# Patient Record
Sex: Female | Born: 1940 | Race: White | Hispanic: No | Smoking: Never smoker
Health system: Southern US, Community
[De-identification: ages and names within clinical notes are randomized; demographics above are authoritative.]

## PROBLEM LIST (undated history)

## (undated) DIAGNOSIS — I10 Essential (primary) hypertension: Secondary | ICD-10-CM

## (undated) DIAGNOSIS — I219 Acute myocardial infarction, unspecified: Secondary | ICD-10-CM

## (undated) DIAGNOSIS — I2510 Atherosclerotic heart disease of native coronary artery without angina pectoris: Secondary | ICD-10-CM

## (undated) DIAGNOSIS — E785 Hyperlipidemia, unspecified: Secondary | ICD-10-CM

## (undated) DIAGNOSIS — E875 Hyperkalemia: Secondary | ICD-10-CM

## (undated) DIAGNOSIS — I255 Ischemic cardiomyopathy: Secondary | ICD-10-CM

## (undated) HISTORY — DX: Acute myocardial infarction, unspecified: I21.9

## (undated) HISTORY — DX: Hyperlipidemia, unspecified: E78.5

## (undated) HISTORY — DX: Essential (primary) hypertension: I10

## (undated) HISTORY — DX: Hyperkalemia: E87.5

## (undated) HISTORY — DX: Ischemic cardiomyopathy: I25.5

## (undated) HISTORY — DX: Atherosclerotic heart disease of native coronary artery without angina pectoris: I25.10

## (undated) HISTORY — PX: ABDOMINAL HYSTERECTOMY: SHX81

## (undated) HISTORY — PX: CHOLECYSTECTOMY: SHX55

---

## 2008-04-29 ENCOUNTER — Inpatient Hospital Stay (HOSPITAL_COMMUNITY): Admission: AD | Admit: 2008-04-29 | Discharge: 2008-05-04 | Payer: Self-pay | Admitting: Cardiology

## 2008-04-29 HISTORY — PX: CARDIAC CATHETERIZATION: SHX172

## 2008-05-01 ENCOUNTER — Encounter: Payer: Self-pay | Admitting: Cardiology

## 2008-05-29 ENCOUNTER — Encounter (HOSPITAL_COMMUNITY): Admission: RE | Admit: 2008-05-29 | Discharge: 2008-06-28 | Payer: Self-pay | Admitting: Cardiology

## 2008-06-29 ENCOUNTER — Encounter (HOSPITAL_COMMUNITY): Admission: RE | Admit: 2008-06-29 | Discharge: 2008-07-29 | Payer: Self-pay | Admitting: Cardiology

## 2008-07-31 ENCOUNTER — Encounter (HOSPITAL_COMMUNITY): Admission: RE | Admit: 2008-07-31 | Discharge: 2008-08-30 | Payer: Self-pay | Admitting: Cardiology

## 2009-04-09 HISTORY — PX: CARDIOVASCULAR STRESS TEST: SHX262

## 2009-04-16 HISTORY — PX: US ECHOCARDIOGRAPHY: HXRAD669

## 2009-10-15 ENCOUNTER — Ambulatory Visit: Payer: Self-pay | Admitting: Cardiology

## 2010-04-24 ENCOUNTER — Ambulatory Visit (INDEPENDENT_AMBULATORY_CARE_PROVIDER_SITE_OTHER): Payer: Medicare Other | Admitting: Cardiology

## 2010-04-24 DIAGNOSIS — E119 Type 2 diabetes mellitus without complications: Secondary | ICD-10-CM

## 2010-04-24 DIAGNOSIS — I2510 Atherosclerotic heart disease of native coronary artery without angina pectoris: Secondary | ICD-10-CM

## 2010-04-24 DIAGNOSIS — I10 Essential (primary) hypertension: Secondary | ICD-10-CM

## 2010-07-02 LAB — COMPREHENSIVE METABOLIC PANEL
ALT: 59 U/L — ABNORMAL HIGH (ref 0–35)
AST: 166 U/L — ABNORMAL HIGH (ref 0–37)
Calcium: 8.3 mg/dL — ABNORMAL LOW (ref 8.4–10.5)
Creatinine, Ser: 0.94 mg/dL (ref 0.4–1.2)
GFR calc Af Amer: >60 mL/min (ref >60)
GFR calc non Af Amer: 59 mL/min — ABNORMAL LOW (ref >60)
Glucose, Bld: 304 mg/dL — ABNORMAL HIGH (ref 70–99)
Sodium: 132 mEq/L — ABNORMAL LOW (ref 135–145)
Total Protein: 6.2 g/dL (ref 6.0–8.3)

## 2010-07-02 LAB — DIFFERENTIAL
Basophils Absolute: 0 10*3/uL (ref 0.0–0.1)
Basophils Relative: 0 % (ref 0–1)
Eosinophils Absolute: 0 10*3/uL (ref 0.0–0.7)
Eosinophils Relative: 0 % (ref 0–5)
Monocytes Absolute: 0.1 10*3/uL (ref 0.1–1.0)
Neutro Abs: 10.8 10*3/uL — ABNORMAL HIGH (ref 1.7–7.7)

## 2010-07-02 LAB — GLUCOSE, CAPILLARY
Glucose-Capillary: 170 mg/dL — ABNORMAL HIGH (ref 70–99)
Glucose-Capillary: 203 mg/dL — ABNORMAL HIGH (ref 70–99)
Glucose-Capillary: 209 mg/dL — ABNORMAL HIGH (ref 70–99)
Glucose-Capillary: 239 mg/dL — ABNORMAL HIGH (ref 70–99)
Glucose-Capillary: 288 mg/dL — ABNORMAL HIGH (ref 70–99)
Glucose-Capillary: 359 mg/dL — ABNORMAL HIGH (ref 70–99)
Glucose-Capillary: 133 mg/dL — ABNORMAL HIGH (ref 70–99)
Glucose-Capillary: 139 mg/dL — ABNORMAL HIGH (ref 70–99)
Glucose-Capillary: 161 mg/dL — ABNORMAL HIGH (ref 70–99)
Glucose-Capillary: 190 mg/dL — ABNORMAL HIGH (ref 70–99)
Glucose-Capillary: 208 mg/dL — ABNORMAL HIGH (ref 70–99)

## 2010-07-02 LAB — LIPID PANEL
Cholesterol: 275 mg/dL — ABNORMAL HIGH (ref 0–200)
HDL: 22 mg/dL — ABNORMAL LOW (ref >39)
Total CHOL/HDL Ratio: 12.5 RATIO
Triglycerides: 338 mg/dL — ABNORMAL HIGH (ref <150)

## 2010-07-02 LAB — CARDIAC PANEL(CRET KIN+CKTOT+MB+TROPI)
CK, MB: 167.1 ng/mL — ABNORMAL HIGH (ref 0.3–4.0)
Relative Index: 4 — ABNORMAL HIGH (ref 0.0–2.5)
Relative Index: 6.3 — ABNORMAL HIGH (ref 0.0–2.5)
Total CK: 2646 U/L — ABNORMAL HIGH (ref 7–177)
Total CK: 4757 U/L — ABNORMAL HIGH (ref 7–177)
Troponin I: 63.23 ng/mL (ref 0.00–0.06)
Troponin I: 92.36 ng/mL (ref 0.00–0.06)
Troponin I: >100 ng/mL (ref 0.00–0.06)

## 2010-07-02 LAB — CBC
HCT: 43.7 % (ref 36.0–46.0)
HCT: 35.3 % — ABNORMAL LOW (ref 36.0–46.0)
Hemoglobin: 15 g/dL (ref 12.0–15.0)
MCHC: 34.4 g/dL (ref 30.0–36.0)
MCHC: 35.1 g/dL (ref 30.0–36.0)
MCV: 90.5 fL (ref 78.0–100.0)
MCV: 90.8 fL (ref 78.0–100.0)
Platelets: 140 10*3/uL — ABNORMAL LOW (ref 150–400)
Platelets: 146 10*3/uL — ABNORMAL LOW (ref 150–400)
RBC: 4.2 MIL/uL (ref 3.87–5.11)
RDW: 12.6 % (ref 11.5–15.5)
RDW: 12.4 % (ref 11.5–15.5)
WBC: 10.3 10*3/uL (ref 4.0–10.5)

## 2010-07-02 LAB — BASIC METABOLIC PANEL
BUN: 12 mg/dL (ref 6–23)
CO2: 23 mEq/L (ref 19–32)
CO2: 24 mEq/L (ref 19–32)
Calcium: 8.5 mg/dL (ref 8.4–10.5)
Calcium: 9.4 mg/dL (ref 8.4–10.5)
Creatinine, Ser: 0.93 mg/dL (ref 0.4–1.2)
GFR calc non Af Amer: >60 mL/min (ref >60)
Glucose, Bld: 210 mg/dL — ABNORMAL HIGH (ref 70–99)
Glucose, Bld: 396 mg/dL — ABNORMAL HIGH (ref 70–99)
Sodium: 134 mEq/L — ABNORMAL LOW (ref 135–145)
Sodium: 136 mEq/L (ref 135–145)

## 2010-07-02 LAB — POCT CARDIAC MARKERS: Troponin i, poc: 0.2 ng/mL — ABNORMAL HIGH (ref 0.00–0.09)

## 2010-07-02 LAB — PROTIME-INR: INR: 1.2 (ref 0.00–1.49)

## 2010-07-02 LAB — HEMOGLOBIN A1C
Hgb A1c MFr Bld: 10.9 % — ABNORMAL HIGH (ref 4.6–6.1)
Mean Plasma Glucose: 266 mg/dL

## 2010-07-30 NOTE — Cardiovascular Report (Signed)
NAMEKELSEI, DEFINO NO.:  [id number]   MEDICAL RECORD NO.:  [id number]          PATIENT TYPE:  INP   LOCATION:  2914                         FACILITY:  [facility]   PHYSICIAN:  Peter M. Swaziland, M.D.  DATE OF BIRTH:  July 03, 1940   DATE OF PROCEDURE:  04/29/2008  DATE OF DISCHARGE:                            CARDIAC CATHETERIZATION   INDICATIONS FOR PROCEDURE:  A 70 year old white female transferred from  [facility] for an acute ST-elevation anterolateral myocardial  infarction.  The patient's chest pain had begun midnight the night  prior.  She presented to [facility] Emergency Room at 9:15 a.m.  ECG  showed a 1-2 mm of ST elevation in leads I, aVL, and V4 through V6.  She  had already had Q waves in leads I and aVL.  She did have ongoing chest  pain and so was transferred to our facility for percutaneous  intervention.  The patient has a history of hypertension.   PROCEDURE:  Left heart catheterization, coronary and left ventricular  angiography.  Intracoronary stenting of the mid LAD.   ACCESS:  Right femoral artery using standard Seldinger technique.   EQUIPMENTS:  A 6-French, 4 cm right and left Judkins catheter, 6-French  pigtail catheter, 6-French arterial sheath, 6-French left Judkins 4  guide, a Prowater wire, 2.5 x 15-mm apex balloon, 2.5 x 23-mm Xience  drug-eluting stent, and a 2.5 x 15-mm Sharon Voyager balloon.   MEDICATIONS:  Local anesthesia with 1% lidocaine, Versed 1 mg IV,  fentanyl 25 mcg IV, nitroglycerin 200 mcg intracoronary x2, and Angiomax  bolus at 0.75 mg/kg followed by continuous infusion at 1.75 mg/kg per  hour.  Subsequent ACT was 316 seconds.  The patient was maintained on IV  nitroglycerin throughout the procedure.  She received an additional 300  mg of Plavix p.o. as she had already received 300 mg at the emergency  department.  She did remain hemodynamically stable throughout procedure.   HEMODYNAMIC DATA:  Aortic pressure is  140/82 with a mean of 108.  Left  ventricular pressure is 119 with an EDP of 16 mmHg.   ANGIOGRAPHIC DATA:  The left coronary artery arises and distributes  normally.  The left main coronary artery is normal.   The left anterior descending artery is small in caliber and calcified.  It is occluded after large septal perforator branch.  There is TIMI  grade 0 flow.  With approximately the third or fourth injection, there  was some spontaneous reperfusion of the LAD.   The left circumflex coronary gives rise to 3 marginal branches.  The  first marginal branch arises very proximally.  It has a 70% ostial  stenosis followed by 50% stenosis in the proximal vessel.  The second  marginal branch is small in caliber and has 70-80% stenosis proximally.   The right coronary is a dominant vessel.  It has a 40% diffuse disease  proximally.  Otherwise, it appears normal.   We proceeded at this point with emergent intervention of the mid LAD.  After the patient was anticoagulated, initial guide shots were obtained.  We were able to cross this lesion fairly easily with a wire.  We  subsequently performed balloon inflations using a 2.5 x 15-mm apex  balloon throughout the mid segment up to 4 atmospheres x4.  While this  did result in antegrade flow, the flow was still very poor and there  appeared to be fairly extensive dissection of the mid LAD after the  first septal perforator.  The distal LAD did appear very small and  diffusely diseased.  We next attempted to stent the mid LAD, but we were  unable to cross with the stent.  We then went back with the 2.5-mm apex  balloon and predilated the entire mid LAD segment up to 12 atmospheres.  We then went back with the stent and were able to cross the lesion,  although with quite a bit of difficulty.  However, we were able to place  it in and adequately covered the lesion.  We deployed the stent at 8 and  then 12 atmospheres with a stent balloon.  We then  postdilated with a  2.5 x 15-mm [place] Voyager balloon up to 14 atmospheres x2.  This yielded an  excellent angiographic result at the stent site with 0% residual  stenosis.  However, due to the extensive disease in the distal LAD and  small vessel size, there was still TIMI grade II flow at the end of the  procedure.  She was painfree at this point and her ECG was stable.   IMPRESSION:  1. Severe 2-vessel obstructive atherosclerotic coronary artery disease      with occlusion of the mid left anterior descending as her culprit      lesion.  2. Successful intracoronary stenting of the mid left anterior      descending late in the course of myocardial infarction,      approximately 11 hours from the onset of her pain to reperfusion.  3. Left ventricular angiography was performed at the end of the      procedure.  This demonstrated extensive anterolateral akinesia with      apical dyskinesia.  Overall ejection fraction of 30-35%.  No      filling defects were noted.  There was no significant mitral      insufficiency.   PLAN:  The patient will be treated with aggressive medical therapy at  this point and transferred to the Coronary Intensive Care Unit.           ______________________________  Peter M. Swaziland, M.D.     PMJ/MEDQ  D:  04/29/2008  T:  04/29/2008  Job:  [id number]   cc:   Ramon Dredge L. Juanetta Gosling, M.D.

## 2010-07-30 NOTE — H&P (Signed)
[name], [name]               ACCOUNT NO.:  [id number]   MEDICAL RECORD NO.:  [id number]          PATIENT TYPE:  INP   LOCATION:  2914                         FACILITY:  [facility]   PHYSICIAN:  Peter M. Swaziland, M.D.  DATE OF BIRTH:  June 04, 1940   DATE OF ADMISSION:  04/29/2008  DATE OF DISCHARGE:                              HISTORY & PHYSICAL   HISTORY OF PRESENT ILLNESS:  [name] is a 70 year old white female,  who has a history of hypertension and is transferred from [facility] for treatment of acute ST elevation myocardial infarction.  The  patient developed chest pain last night approximately midnight.  It was  8/10.  It is described as crushing substernal chest pain radiating to  both arms associated with shortness of breath.  Pain persisted  throughout the night and worsened at approximately 7:00 a.m., then she  decided to go to the emergency room.  On arrival there at approximately  9:15 a.m., she had ECG which was consistent with acute anterior lateral  ST elevation myocardial infarction.  She had ST elevation of 1-2 mm in  leads I, aVL, and V4 through V6 with Q-waves in leads I and aVL.  She  had ongoing chest pain.  She was treated with heparin, oral Plavix,  aspirin, IV nitroglycerin, and IV beta-blockers without relief of her  pain.  She was transferred here for acute intervention.   PAST MEDICAL HISTORY:  Significant for hypertension.  She has had prior  cholecystectomy and prior hysterectomy.  She has no known history of  diabetes or hypercholesterolemia.  She is allergic to NOVOCAIN.  She is  on some type of blood pressure medication.   SOCIAL HISTORY:  She is widowed.  She has 3 children.  She is a retired  Manufacturing systems engineer.  She denies smoking or alcohol use.   FAMILY HISTORY:  Negative for coronary artery disease.   REVIEW OF SYSTEMS:  She has had no claudication.  She denies any edema,  orthopnea, or PND.  She has had no history of bleeding  disorders or  stroke.  Appetite has been normal.  She has had no change in bowel or  bladder habits.  All other systems were reviewed and are negative.   PHYSICAL EXAMINATION:  GENERAL:  The patient is a well-developed white  female in moderate distress.  VITAL SIGNS:  Her pulse rate was 90 and regular, blood pressure was  145/76, and respirations were 20.  HEENT:  She is normocephalic and atraumatic.  Pupils are equal, round,  and reactive.  Sclerae clear.  Extraocular movements are full.  Oropharynx is clear.  She has upper dental plate and is edentulous on  her lower gum.  Oropharynx is otherwise clear.  NECK:  Without JVD, adenopathy, thyromegaly, or bruits.  LUNGS:  Clear to auscultation and percussion.  CARDIAC:  Regular rate and rhythm without gallop, murmur, rub, or click.  ABDOMEN:  Soft and nontender without masses or bruits.  Bowel sounds are  positive.  EXTREMITIES:  She does have a small lipoma on her left hip.  Femoral  and  pedal pulses are 2+ and symmetric.  She has no edema or cyanosis.  SKIN:  Warm and dry.  NEUROLOGIC:  She is alert and oriented x4.  Cranial nerves II through  XII are intact.  She has grossly normal motor exam.   LABORATORY DATA:  ECG demonstrates normal sinus rhythm.  There is 2 mm  of ST elevation in leads I, aVL, V4 through V6 with Q-waves in leads I  and aVL consistent with acute lateral myocardial infarction.  White  count was 11,800, hemoglobin 15, hematocrit 43.7, and platelets 171,000.  Initial myoglobin was 497 with a CPK-MB of 7.9 and a total troponin of  0.2.  Sodium is 136, potassium 3.8, chloride 102, CO2 23, glucose was  396, BUN 11, and creatinine 0.91.  Coags were normal.  Chest x-ray  showed no active disease.   IMPRESSION:  1. Acute lateral ST elevation myocardial infarction.  The patient is      presenting very late in the course of her infarction.  She has      ongoing chest pain.  2. Hypertension.  3. Hyperglycemia.  4.  Status post cholecystectomy.  5. Allergy to Novocain.   PLAN:  We will proceed with emergent cardiac catheterization with  potential catheter-based revascularization.           ______________________________  Peter M. Swaziland, M.D.     PMJ/MEDQ  D:  04/29/2008  T:  04/29/2008  Job:  [id number]   cc:   Ramon Dredge L. Juanetta Gosling, M.D.

## 2010-07-30 NOTE — Discharge Summary (Signed)
NAMEGILLIAN, [name]               ACCOUNT NO.:  [id number]   MEDICAL RECORD NO.:  [id number]          PATIENT TYPE:  INP   LOCATION:  2038                         FACILITY:  [facility]   PHYSICIAN:  Peter M. Swaziland, M.D.  DATE OF BIRTH:  [birth year]   DATE OF ADMISSION:  04/29/2008  DATE OF DISCHARGE:  05/04/2008                               DISCHARGE SUMMARY   HISTORY OF PRESENT ILLNESS:  [name] is a 70 year old white female,  who is admitted with an acute ST-elevation anterolateral myocardial  infarction.  The patient presented to [facility] approximately  9 hours until the onset of her chest pain.  This was described as a  severe, crushing, substernal chest pain radiating to both arms  associated with shortness of breath.  Pain persisted throughout the  night.  On arrival, she was noted to have ST-elevation 1-2 mm in leads  I, aVL, and V4 through V6 with Q-waves in leads I and aVL.  She had  ongoing chest pain.  She was initially treated with heparin, Plavix,  aspirin, IV nitroglycerin, IV beta-blockers and was transferred to our  facility for acute intervention.  The patient does have a known history  of hypertension.  She otherwise reported being in good health.   For details of her past medical history, social history, family history,  and physical exam please see admission history and physical.   LABORATORY DATA ON ADMISSION:  Her ECG was as noted, it showed 2 mm of  ST elevation in leads I, aVL, V4 through V6 with Q-waves already in I  and aVL.  White count was 11,800, hemoglobin is 15, hematocrit 43.7,  platelets 171,000.  Initial myoglobin on point-of-care was 497 with CPK-  MB of 70.9, and a troponin of 0.2.  Sodium 136, potassium 3.8, chloride  102, CO2 23, glucose 396, BUN 11, and creatinine of 0.91.  Coags were  normal.  Chest x-ray showed no active disease.   HOSPITAL COURSE:  The patient was taken emergently to the cardiac  catheterization laboratory.   The coronary angiography demonstrated that  the left anterior descending artery was occluded in the midvessel after  a large septal perforator.  The LAD was small in caliber and diffusely  calcified.  Left circumflex coronary demonstrated a 70% stenosis  followed by 50% stenosis in the first obtuse marginal vessel.  The  second marginal branch had a 78% stenosis proximally.  These vessels  were relatively small and diffusely diseased.  The right coronary was a  dominant vessel, it had a 30-40% diffuse narrowing proximally, otherwise  appeared normal.  Left ventricular angiography performed and the  procedure demonstrated extensive anterolateral akinesia with apical  dyskinesia and overall ejection fraction of 30-35%.  The patient had  emergent stenting of the mid LAD.  This was stented using a 2.5 x 23-mm  Xience drug-eluting stent.  It was postdilated to 2.5 mm.  This did  yield excellent angiographic result in the midvessel with 0% residual  stenosis.  There was extensive disease in the distal LAD with small  vessel caliber.  Flow  was graded at TIMI II at the end of the procedure.  She was pain-free at this point.  She was subsequently transferred to  the intensive care unit for continued management.  Given her extensive  anterolateral and apical infarct, she was anticoagulated with Lovenox in  addition to aspirin and Plavix given concern of apical thrombus.  She  was placed on old beta-blockers and ACE inhibitors.  She was started on  statin therapy.  The patient was severely hyperglycemic during her  hospitalization and subsequent A1c was 10.9%.  She was started on  sliding scale insulin and later switched to nightly Lantus and then  later NPH insulin.  Towards, the latter part of her hospital stay, she  was started on metformin and with this her glycemic control improved  significantly and day prior to discharge, her blood sugars were running  anywhere from 130-160.  At this point, it  was felt that she could be  discharged without insulin therapy and continued on oral therapy.   The patient during the first 24 hours did have a lot of reperfusion  arrhythmias including the ventricular and junctional rhythms.  These  resolved during her subsequent hospital course, and she had no  subsequent significant arrhythmias.  Her blood pressure remained under  excellent control.  Her renal function and blood counts remained under  good control with starting her on Coumadin.  She had progressive  increase in her INR to the time of discharge, it was 2.1 and at this  point, her Lovenox was discontinued, and she was discharged home on  Coumadin 5 mg per day.  Her CPK increased to 4757 with 431 MB, then  declined to 2646 and with 167 MB, and then 2091 with 83.8 MB.  Troponins  increased to greater than 100, then declined to 92.36 and 63.23.  Cholesterol panel showed a total cholesterol of 275, triglycerides of  338, HDL of 22, and LDL of 185.  During the hospital stay, she was  treated with high-dose Crestor.  Given the patient's lack of insurance  coverage, it was felt that she cannot afford this as an outpatient.  She  was switched to pravastatin 40 mg per day on discharge.  B-natriuretic  peptide level was 536.  Blood counts remained stable at the time of  discharge.  Her protime was 25 with an INR of 2.1.  TSH was 4.373.  An  echocardiogram was obtained.  This demonstrated extensive anterolateral  akinesia with apical dyskinesia.  There was moderate anteroseptal  hypokinesia.  Overall ejection fraction was 30-35%.  There was no left  ventricular thrombus noted, but there was significant spontaneous echo  contrast consistent with stasis.  There were no significant valvular  abnormalities.  The patient was given diabetic teaching.  This included  dietary recommendations and use of insulin.  We also made arrangements  for her to have home glucose monitor.  We applied for patient  assistance  with [employer] to cover her Plavix, and she was given a 14-  day prescription for Plavix to receive a free supply for initial 14  days.  The patient was seen by Cardiac Rehabilitation, progressively  ambulated.  She was given instructions on subsequent activity.  She did  very well with these activities and was discharged home in stable  condition on May 04, 2008.   DISCHARGE DIAGNOSES:  1. Acute anterolateral ST-elevation myocardial infarction.  The      patient presented late in the course of  her infarct.  She did have      successful stenting of the mid left anterior descending.  She does      have diffuse moderate-to-severe coronary disease involving the left      circumflex distribution.  2. Ischemic cardiomyopathy with ejection fraction of 30-35%.  3. Diabetes mellitus, new onset.  4. Hypertension.  5. Combined dyslipidemia.  6. Prior cholecystectomy.   DISCHARGE MEDICATIONS:  1. Aspirin 325 mg per day.  2. Plavix 75 mg per day.  3. Coumadin 5 mg daily.  4. Carvedilol 12.5 mg twice a day.  5. Lisinopril 5 mg daily.  6. Pravastatin 40 mg per day.  7. Nitroglycerin 0.4 mg sublingual p.r.n.  8. Metformin 500 mg twice a day.   She is instructed on a low-sodium heart-healthy diet with carbohydrate  modification.  She is not to drive for 2 weeks.  She is to increase her  activity as slowly as prescribed and recommending she have followup  protime in Dr.  Juanetta Gosling office on Monday, February 22, since this will be much more  convenient for her.  I will plan on seeing her back in 2 weeks for  followup.   DISCHARGE STATUS:  Improved.           ______________________________  Peter M. Swaziland, M.D.     PMJ/MEDQ  D:  05/04/2008  T:  05/04/2008  Job:  [id number]   cc:   Ramon Dredge L. Juanetta Gosling, M.D.

## 2010-08-06 ENCOUNTER — Other Ambulatory Visit: Payer: Self-pay | Admitting: Cardiology

## 2010-08-06 NOTE — Telephone Encounter (Signed)
PT'S DAUGHTER WANTS TO TALK TO ANITA ABOUT PT'S PLAVIX. CHART IN BOX.

## 2010-08-06 NOTE — Telephone Encounter (Signed)
Daughter called requesting refill on Plavix. Called [facility] [phone] to see if she was eligible for refill. Was told she will get one more 90 day supply and then would get generic from local pharm. Advised daughter.

## 2010-08-20 ENCOUNTER — Telehealth: Payer: Self-pay | Admitting: *Deleted

## 2010-08-20 NOTE — Telephone Encounter (Signed)
Daughter Archie Patten) called requesting status of Plavix that is coming from [facility] . I had called them first of May and they were to ship last shipment out. Called again today  [phone] and was told last shipment is being shipped today. Notified Ms. Nieland. Her daughter will come by tomorrow to get a few samples.

## 2010-08-23 ENCOUNTER — Telehealth: Payer: Self-pay | Admitting: *Deleted

## 2010-08-23 NOTE — Telephone Encounter (Signed)
Pt called and informed plavix arrived, 90 day supplyJodette Clements/[place]

## 2010-10-02 ENCOUNTER — Other Ambulatory Visit: Payer: Self-pay | Admitting: Cardiology

## 2010-10-02 MED ORDER — CARVEDILOL 12.5 MG PO TABS: 12.5000 mg | ORAL_TABLET | Freq: Two times a day (BID) | ORAL | Status: DC

## 2010-10-02 NOTE — Telephone Encounter (Signed)
Daughter called requesting refill on Carvedilol. Sent to [facility] [place]

## 2010-10-02 NOTE — Telephone Encounter (Signed)
CALL SON REGARDING PT'S MEDS. CHART IN BOX.

## 2010-11-12 ENCOUNTER — Encounter: Payer: Self-pay | Admitting: Cardiology

## 2010-11-14 ENCOUNTER — Other Ambulatory Visit: Payer: Self-pay | Admitting: Cardiology

## 2010-11-14 NOTE — Telephone Encounter (Signed)
Called needing a refill of her Plavix filled at the [facility] in [place]. Please call Kenney Houseman back because she would like to speak with you about her mother's generic change over. I have pulled her chart.

## 2010-11-15 ENCOUNTER — Other Ambulatory Visit: Payer: Self-pay | Admitting: Cardiology

## 2010-11-15 MED ORDER — CLOPIDOGREL BISULFATE 75 MG PO TABS: 75.0000 mg | ORAL_TABLET | Freq: Every day | ORAL | Status: DC

## 2010-11-15 MED ORDER — CARVEDILOL 12.5 MG PO TABS: 12.5000 mg | ORAL_TABLET | Freq: Two times a day (BID) | ORAL | Status: DC

## 2010-11-15 NOTE — Telephone Encounter (Signed)
Pt called back said that she would like a 30 day supply of meds that was discussed earlier

## 2010-11-15 NOTE — Telephone Encounter (Signed)
Daughter called requesting refill on Plavix (30 day supply) and Carvedilol (30 day) to [facility] [place]

## 2010-11-27 ENCOUNTER — Ambulatory Visit (INDEPENDENT_AMBULATORY_CARE_PROVIDER_SITE_OTHER): Payer: Medicare Other | Admitting: Cardiology

## 2010-11-27 ENCOUNTER — Encounter: Payer: Self-pay | Admitting: Cardiology

## 2010-11-27 DIAGNOSIS — E785 Hyperlipidemia, unspecified: Secondary | ICD-10-CM

## 2010-11-27 DIAGNOSIS — I2589 Other forms of chronic ischemic heart disease: Secondary | ICD-10-CM

## 2010-11-27 DIAGNOSIS — I219 Acute myocardial infarction, unspecified: Secondary | ICD-10-CM | POA: Insufficient documentation

## 2010-11-27 DIAGNOSIS — I255 Ischemic cardiomyopathy: Secondary | ICD-10-CM

## 2010-11-27 DIAGNOSIS — I2510 Atherosclerotic heart disease of native coronary artery without angina pectoris: Secondary | ICD-10-CM | POA: Insufficient documentation

## 2010-11-27 DIAGNOSIS — E119 Type 2 diabetes mellitus without complications: Secondary | ICD-10-CM

## 2010-11-27 DIAGNOSIS — I10 Essential (primary) hypertension: Secondary | ICD-10-CM | POA: Insufficient documentation

## 2010-11-27 NOTE — Patient Instructions (Signed)
Continue your current medications.  We will await the results of your lab work in October.  I will see you again in 6 months.

## 2010-11-27 NOTE — Assessment & Plan Note (Signed)
She is scheduled for followup lab work in next month with her primary care physician. We will await these results.

## 2010-11-27 NOTE — Assessment & Plan Note (Signed)
Blood pressure is elevated today but her readings at home have been excellent. We will continue to monitor on her current therapy.

## 2010-11-27 NOTE — Progress Notes (Signed)
   [name] Date of Birth: 1940/10/30   History of Present Illness: [name] is seen today for followup. She reports that she is doing very well. Her diabetes has improved and her medication was reduced. Her blood pressure at home has been normal with a blood pressure reading of 120/60 this morning. She denies any chest pain, shortness of breath, palpitations, or edema. She walks 3-4 times per day.  Current Outpatient Prescriptions on File Prior to Visit  Medication Sig Dispense Refill  . aspirin 325 MG tablet Take 325 mg by mouth daily.        . carvedilol (COREG) 12.5 MG tablet Take 1 tablet (12.5 mg total) by mouth 2 (two) times daily.  180 tablet  3  . clopidogrel (PLAVIX) 75 MG tablet Take 1 tablet (75 mg total) by mouth daily.  30 tablet  5  . fish oil-omega-3 fatty acids 1000 MG capsule Take 2 g by mouth daily.        . metFORMIN (GLUCOPHAGE) 500 MG tablet Take 1,000 mg by mouth 2 (two) times daily with a meal.        . nitroGLYCERIN (NITROSTAT) 0.4 MG SL tablet Place 0.4 mg under the tongue every 5 (five) minutes as needed.        . pravastatin (PRAVACHOL) 40 MG tablet Take 40 mg by mouth daily.          Allergies  Allergen Reactions  . Ace Inhibitors   . Codeine   . Novocain     Past Medical History  Diagnosis Date  . Coronary artery disease   . MI (myocardial infarction)   . Ischemic cardiomyopathy   . Diabetes mellitus   . Hyperlipidemia   . Hypertension     Past Surgical History  Procedure Date  . Cardiac catheterization 04/29/2008    EF 30-35%  . US echocardiography 04/16/2009    EF 35-40%  . Cardiovascular stress test 04/09/2009    EF 37%    History  Smoking status  . Never Smoker   Smokeless tobacco  . Not on file    History  Alcohol Use No    History reviewed. No pertinent family history.  Review of Systems: As noted in history of present illness.  All other systems were reviewed and are negative.  Physical Exam: BP 162/82  Pulse 62  Ht  5\' 6"  (1.676 m)  Wt 128 lb (58.06 kg)  BMI 20.66 kg/m2 The patient is alert and oriented x 3.  The mood and affect are normal.  The skin is warm and dry.  Color is normal.  The HEENT exam reveals that the sclera are nonicteric.  The mucous membranes are moist.  The carotids are 2+ without bruits.  There is no thyromegaly.  There is no JVD.  The lungs are clear.  The chest wall is non tender.  The heart exam reveals a regular rate with a normal S1 and S2.  There are no murmurs, gallops, or rubs.  The PMI is not displaced.   Abdominal exam reveals good bowel sounds.  There is no guarding or rebound.  There is no hepatosplenomegaly or tenderness.  There are no masses.  Exam of the legs reveal no clubbing, cyanosis, or edema.  The legs are without rashes.  The distal pulses are intact.  Cranial nerves II - XII are intact.  Motor and sensory functions are intact.  The gait is normal.  LABORATORY DATA:   Assessment / Plan:

## 2010-11-27 NOTE — Assessment & Plan Note (Signed)
Her last nuclear stress test in January of 2011 showed a large infarct in the anterior wall and apex. There was no other ischemia noted. She remains asymptomatic. We'll continue with dual antiplatelet therapy.

## 2010-11-27 NOTE — Assessment & Plan Note (Signed)
She has no symptoms of congestive heart failure. She appears to be euvolemic. We will continue with her current therapy

## 2011-02-13 ENCOUNTER — Encounter: Payer: Self-pay | Admitting: Cardiology

## 2011-03-14 ENCOUNTER — Other Ambulatory Visit: Payer: Self-pay

## 2011-03-14 MED ORDER — PRAVASTATIN SODIUM 40 MG PO TABS: 40.0000 mg | ORAL_TABLET | Freq: Every day | ORAL | Status: DC

## 2011-04-07 ENCOUNTER — Encounter: Payer: Self-pay | Admitting: Cardiology

## 2011-04-09 ENCOUNTER — Telehealth: Payer: Self-pay

## 2011-04-09 NOTE — Telephone Encounter (Signed)
Pt referred from Dr. Juanetta Gosling for a screening colonoscopy. She said she is not having any rectal bleeding or any GI problems and does not wish to schedule at this time. Her son is also in the hospital now and she is having financial difficulties. I offered her Kathie Rhodes Ratliff's phone number but she declined at this time, and said she will call me back if she wants to call her, and if she wants to schedule the colonoscopy.

## 2011-04-09 NOTE — Telephone Encounter (Signed)
REVIEWED. AGREE. 

## 2011-04-09 NOTE — Telephone Encounter (Signed)
I also faxed a note explaining this to Dr. Juanetta Gosling.

## 2011-05-16 ENCOUNTER — Other Ambulatory Visit: Payer: Self-pay | Admitting: Cardiology

## 2011-05-16 NOTE — Telephone Encounter (Signed)
Refilled plavix 

## 2011-06-05 ENCOUNTER — Encounter: Payer: Self-pay | Admitting: Cardiology

## 2011-06-05 ENCOUNTER — Ambulatory Visit (INDEPENDENT_AMBULATORY_CARE_PROVIDER_SITE_OTHER): Payer: Medicare Other | Admitting: Cardiology

## 2011-06-05 VITALS — BP 150/82 | HR 74 | Wt 134.2 lb

## 2011-06-05 DIAGNOSIS — I252 Old myocardial infarction: Secondary | ICD-10-CM

## 2011-06-05 DIAGNOSIS — I2589 Other forms of chronic ischemic heart disease: Secondary | ICD-10-CM

## 2011-06-05 DIAGNOSIS — I10 Essential (primary) hypertension: Secondary | ICD-10-CM

## 2011-06-05 DIAGNOSIS — I2510 Atherosclerotic heart disease of native coronary artery without angina pectoris: Secondary | ICD-10-CM

## 2011-06-05 DIAGNOSIS — I255 Ischemic cardiomyopathy: Secondary | ICD-10-CM

## 2011-06-05 DIAGNOSIS — E785 Hyperlipidemia, unspecified: Secondary | ICD-10-CM

## 2011-06-05 NOTE — Assessment & Plan Note (Signed)
She has no signs or symptoms of congestive heart failure. She is intolerant to ACE inhibitors. We will continue carvedilol.

## 2011-06-05 NOTE — Assessment & Plan Note (Signed)
She status post intramyocardial infarction in 2010 treated with direct stenting of the mid LAD. Her last stress test was in January 2011. She remains asymptomatic. We will continue on her medical therapy with aspirin, Plavix, and statin therapy. She is also on carvedilol. I'll see her again in 6 months. Unless she has recurrent symptoms we will plan on a stress test in one year.

## 2011-06-05 NOTE — Progress Notes (Signed)
[name] Date of Birth: 1940/05/30   History of Present Illness: [name] is seen today for followup. She reports that she is doing very well. She is any chest pain or shortness of breath. She has not been as active this winter because she can't get outside as much in the cold air. She reports her blood pressure has been doing very well. She thinks it is elevated today because she has been rushing around trying to get to her appointment.  Current Outpatient Prescriptions on File Prior to Visit  Medication Sig Dispense Refill  . aspirin 325 MG tablet Take 325 mg by mouth daily.        . carvedilol (COREG) 12.5 MG tablet Take 1 tablet (12.5 mg total) by mouth 2 (two) times daily.  180 tablet  3  . clopidogrel (PLAVIX) 75 MG tablet TAKE ONE TABLET BY MOUTH EVERY DAY  30 tablet  5  . fish oil-omega-3 fatty acids 1000 MG capsule Take 2 g by mouth daily.        . metFORMIN (GLUCOPHAGE) 500 MG tablet Take 500 mg by mouth 2 (two) times daily. 1/2 in the morning and 1/2 in the evening      . nitroGLYCERIN (NITROSTAT) 0.4 MG SL tablet Place 0.4 mg under the tongue every 5 (five) minutes as needed.        . pravastatin (PRAVACHOL) 40 MG tablet Take 1 tablet (40 mg total) by mouth daily.  30 tablet  5    Allergies  Allergen Reactions  . Ace Inhibitors   . Codeine   . Novocain     Past Medical History  Diagnosis Date  . Coronary artery disease   . MI (myocardial infarction)   . Ischemic cardiomyopathy   . Diabetes mellitus   . Hyperlipidemia   . Hypertension     Past Surgical History  Procedure Date  . Cardiac catheterization 04/29/2008    EF 30-35%  . US echocardiography 04/16/2009    EF 35-40%  . Cardiovascular stress test 04/09/2009    EF 37%    History  Smoking status  . Never Smoker   Smokeless tobacco  . Not on file    History  Alcohol Use No    History reviewed. No pertinent family history.  Review of Systems: As noted in history of present illness.  All other  systems were reviewed and are negative.  Physical Exam: BP 150/82  Pulse 74  Wt 60.873 kg (134 lb 3.2 oz) The patient is alert and oriented x 3.  The mood and affect are normal.  The skin is warm and dry.  Color is normal.  The HEENT exam reveals that the sclera are nonicteric.  The mucous membranes are moist.  The carotids are 2+ without bruits.  There is no thyromegaly.  There is no JVD.  The lungs are clear.  The chest wall is non tender.  The heart exam reveals a regular rate with a normal S1 and S2.  There are no murmurs, gallops, or rubs.  The PMI is not displaced.   Abdominal exam reveals good bowel sounds.  There is no guarding or rebound.  There is no hepatosplenomegaly or tenderness.  There are no masses.  Exam of the legs reveal no clubbing, cyanosis, or edema.  The legs are without rashes.  The distal pulses are intact.  Cranial nerves II - XII are intact.  Motor and sensory functions are intact.  The gait is normal.  LABORATORY  DATA: ECG today demonstrates normal sinus rhythm with an old septal infarction.  Assessment / Plan: And

## 2011-06-05 NOTE — Assessment & Plan Note (Signed)
Blood pressure is elevated today but has been under good control elsewhere. We will continue to monitor.

## 2011-06-05 NOTE — Assessment & Plan Note (Signed)
She is on chronic therapy with Pravachol and fish oil. We will request a copy of her last lab work from Dr. Juanetta Gosling.

## 2011-06-05 NOTE — Patient Instructions (Signed)
Continue your current medication.  I will see you again in 6 months.  We will get a copy of your lab work from Dr. Juanetta Gosling.

## 2011-09-05 ENCOUNTER — Other Ambulatory Visit: Payer: Self-pay | Admitting: Cardiology

## 2011-11-14 ENCOUNTER — Other Ambulatory Visit: Payer: Self-pay | Admitting: Cardiology

## 2011-11-14 MED ORDER — CLOPIDOGREL BISULFATE 75 MG PO TABS: 75.0000 mg | ORAL_TABLET | Freq: Every day | ORAL | Status: DC

## 2011-11-14 MED ORDER — CARVEDILOL 12.5 MG PO TABS: 12.5000 mg | ORAL_TABLET | Freq: Two times a day (BID) | ORAL | Status: DC

## 2011-11-14 NOTE — Telephone Encounter (Signed)
Fax Received. Refill Completed. [name] (R.M.A)   

## 2011-12-22 ENCOUNTER — Encounter: Payer: Self-pay | Admitting: Cardiology

## 2011-12-22 ENCOUNTER — Ambulatory Visit (INDEPENDENT_AMBULATORY_CARE_PROVIDER_SITE_OTHER): Payer: Medicare Other | Admitting: Cardiology

## 2011-12-22 ENCOUNTER — Other Ambulatory Visit: Payer: Self-pay

## 2011-12-22 VITALS — BP 182/100 | HR 66 | Ht 66.0 in | Wt 131.1 lb

## 2011-12-22 DIAGNOSIS — I2510 Atherosclerotic heart disease of native coronary artery without angina pectoris: Secondary | ICD-10-CM

## 2011-12-22 DIAGNOSIS — E785 Hyperlipidemia, unspecified: Secondary | ICD-10-CM

## 2011-12-22 DIAGNOSIS — I10 Essential (primary) hypertension: Secondary | ICD-10-CM

## 2011-12-22 DIAGNOSIS — I255 Ischemic cardiomyopathy: Secondary | ICD-10-CM

## 2011-12-22 DIAGNOSIS — I2589 Other forms of chronic ischemic heart disease: Secondary | ICD-10-CM

## 2011-12-22 MED ORDER — ASPIRIN 81 MG PO TABS: 81.0000 mg | ORAL_TABLET | Freq: Every day | ORAL | Status: DC

## 2011-12-22 NOTE — Progress Notes (Signed)
[name] Date of Birth: [birth year]   History of Present Illness: [name] is seen today for followup. She has a history of coronary disease and is status post anterior lateral myocardial infarction in February 2010. She is direct stenting of the LAD with a drug-eluting stent although her presentation was somewhat late. She also had a 70% ostial left circumflex lesion and a 70% second marginal lesion. She continues to do very well. She denies any chest pain, shortness of breath, or palpitations. She walks daily. She continues to do her own housework and works in her garden. Typically her blood pressure at home is between 132 at 142 systolic.  Current Outpatient Prescriptions on File Prior to Visit  Medication Sig Dispense Refill  . aspirin 325 MG tablet Take 325 mg by mouth daily.        . carvedilol (COREG) 12.5 MG tablet Take 1 tablet (12.5 mg total) by mouth 2 (two) times daily.  180 tablet  3  . clopidogrel (PLAVIX) 75 MG tablet Take 1 tablet (75 mg total) by mouth daily.  30 tablet  5  . fish oil-omega-3 fatty acids 1000 MG capsule Take 2 g by mouth daily.        . metFORMIN (GLUCOPHAGE) 500 MG tablet Take 500 mg by mouth 2 (two) times daily. 1/2 in the morning and 1/2 in the evening      . nitroGLYCERIN (NITROSTAT) 0.4 MG SL tablet Place 0.4 mg under the tongue every 5 (five) minutes as needed.        . pravastatin (PRAVACHOL) 40 MG tablet TAKE ONE TABLET BY MOUTH EVERY DAY  30 tablet  12    Allergies  Allergen Reactions  . Ace Inhibitors   . Codeine   . Procaine Hcl     Past Medical History  Diagnosis Date  . Coronary artery disease   . MI (myocardial infarction)   . Ischemic cardiomyopathy   . Diabetes mellitus   . Hyperlipidemia   . Hypertension     Past Surgical History  Procedure Date  . Cardiac catheterization 04/29/2008    EF 30-35%  . US echocardiography 04/16/2009    EF 35-40%  . Cardiovascular stress test 04/09/2009    EF 37%    History  Smoking status    . Never Smoker   Smokeless tobacco  . Not on file    History  Alcohol Use No    History reviewed. No pertinent family history.  Review of Systems: As noted in history of present illness.  All other systems were reviewed and are negative.  Physical Exam: BP 182/100  Pulse 66  Ht 5\' 6"  (1.676 m)  Wt 131 lb 1.9 oz (59.476 kg)  BMI 21.16 kg/m2  SpO2 95% The patient is alert and oriented x 3.  The mood and affect are normal.  The skin is warm and dry.  Color is normal.  The HEENT exam reveals that the sclera are nonicteric.  The mucous membranes are moist.  The carotids are 2+ without bruits.  There is no thyromegaly.  There is no JVD.  The lungs are clear.  The chest wall is non tender.  The heart exam reveals a regular rate with a normal S1 and S2.  There are no murmurs, gallops, or rubs.  The PMI is not displaced.   Abdominal exam reveals good bowel sounds.  There is no guarding or rebound.  There is no hepatosplenomegaly or tenderness.  There are no masses.  Exam of the legs reveal no clubbing, cyanosis, or edema.  The legs are without rashes.  The distal pulses are intact.  Cranial nerves II - XII are intact.  Motor and sensory functions are intact.  The gait is normal.  LABORATORY DATA:   Assessment / Plan: 1. Coronary disease status post anterolateral myocardial infarction 2010. Status post direct stenting of the LAD with a drug-eluting stent. She remains asymptomatic. Her last stress test was in January 2011. We will see her again in 6 months and plan on a followup stress test next year. I recommended reducing her aspirin 81 mg daily. She will stay on Plavix. Continue carvedilol.  2. Ischemic cardiomyopathy. She has been intolerant of ACE inhibitors. We will continue carvedilol.  3. Hyperlipidemia well controlled by previous lab work in January.  4. Hypertension blood pressure is significantly elevated today. Patient reports good blood pressure readings at home. She is going to  monitor this closely.

## 2011-12-22 NOTE — Patient Instructions (Signed)
Reduce ASA to 81 mg daily.  I will see you again in 6 months.

## 2012-04-07 ENCOUNTER — Other Ambulatory Visit: Payer: Self-pay

## 2012-04-07 MED ORDER — CLOPIDOGREL BISULFATE 75 MG PO TABS: 75.0000 mg | ORAL_TABLET | Freq: Every day | ORAL | Status: DC

## 2012-05-07 ENCOUNTER — Other Ambulatory Visit: Payer: Self-pay | Admitting: Cardiology

## 2012-07-12 ENCOUNTER — Encounter: Payer: Self-pay | Admitting: Cardiology

## 2012-07-12 ENCOUNTER — Ambulatory Visit (INDEPENDENT_AMBULATORY_CARE_PROVIDER_SITE_OTHER): Payer: Medicare Other | Admitting: Cardiology

## 2012-07-12 VITALS — BP 140/82 | HR 61 | Ht 66.0 in | Wt 135.0 lb

## 2012-07-12 DIAGNOSIS — I255 Ischemic cardiomyopathy: Secondary | ICD-10-CM

## 2012-07-12 DIAGNOSIS — I2589 Other forms of chronic ischemic heart disease: Secondary | ICD-10-CM

## 2012-07-12 DIAGNOSIS — I2510 Atherosclerotic heart disease of native coronary artery without angina pectoris: Secondary | ICD-10-CM

## 2012-07-12 DIAGNOSIS — E785 Hyperlipidemia, unspecified: Secondary | ICD-10-CM

## 2012-07-12 DIAGNOSIS — I219 Acute myocardial infarction, unspecified: Secondary | ICD-10-CM

## 2012-07-12 DIAGNOSIS — I10 Essential (primary) hypertension: Secondary | ICD-10-CM

## 2012-07-12 MED ORDER — NITROGLYCERIN 0.4 MG SL SUBL: 0.4000 mg | SUBLINGUAL_TABLET | SUBLINGUAL | Status: AC | PRN

## 2012-07-12 MED ORDER — PRAVASTATIN SODIUM 40 MG PO TABS: 40.0000 mg | ORAL_TABLET | Freq: Every day | ORAL | Status: DC

## 2012-07-12 MED ORDER — CLOPIDOGREL BISULFATE 75 MG PO TABS: 75.0000 mg | ORAL_TABLET | Freq: Every day | ORAL | Status: DC

## 2012-07-12 MED ORDER — CARVEDILOL 12.5 MG PO TABS: 12.5000 mg | ORAL_TABLET | Freq: Two times a day (BID) | ORAL | Status: DC

## 2012-07-12 NOTE — Patient Instructions (Signed)
Continue your current therapy.,  We will schedule you for a nuclear stress test.  I will see you in 6 months.

## 2012-07-12 NOTE — Progress Notes (Signed)
[name] Date of Birth: 12/22/1940   History of Present Illness: [name] is seen today for followup. She has a history of coronary disease and is status post anterior lateral myocardial infarction in February 2010. She was treated with direct stenting of the mid LAD with a drug-eluting stent although her presentation was somewhat late. She also had a 70% ostial left circumflex lesion and a 70% second marginal lesion. Her last stress test was in January 2011. She continues to do very well. She denies any chest pain, shortness of breath, or palpitations. She walks daily. She continues to do her own housework and works in her garden. Her blood pressure at home has been well controlled.  Current Outpatient Prescriptions on File Prior to Visit  Medication Sig Dispense Refill  . aspirin 81 MG tablet Take 1 tablet (81 mg total) by mouth daily.  30 tablet  6  . fish oil-omega-3 fatty acids 1000 MG capsule Take 2 g by mouth daily.        . metFORMIN (GLUCOPHAGE) 500 MG tablet Take 500 mg by mouth 2 (two) times daily. 1/2 in the morning and 1/2 in the evening       No current facility-administered medications on file prior to visit.    Allergies  Allergen Reactions  . Ace Inhibitors   . Codeine   . Procaine Hcl     Past Medical History  Diagnosis Date  . Coronary artery disease   . MI (myocardial infarction)   . Ischemic cardiomyopathy   . Diabetes mellitus   . Hyperlipidemia   . Hypertension     Past Surgical History  Procedure Laterality Date  . Cardiac catheterization  04/29/2008    EF 30-35%  . US echocardiography  04/16/2009    EF 35-40%  . Cardiovascular stress test  04/09/2009    EF 37%    History  Smoking status  . Never Smoker   Smokeless tobacco  . Not on file    History  Alcohol Use No    History reviewed. No pertinent family history.  Review of Systems: As noted in history of present illness.  All other systems were reviewed and are negative.  Physical  Exam: BP 140/82  Pulse 61  Ht 5\' 6"  (1.676 m)  Wt 135 lb (61.236 kg)  BMI 21.8 kg/m2 The patient is alert and oriented x 3.  The mood and affect are normal.  The skin is warm and dry.  Color is normal.  The HEENT exam is normal.  The carotids are 2+ without bruits.  There is no thyromegaly.  There is no JVD.  The lungs are clear. The heart exam reveals a regular rate with a normal S1 and S2.  There are no murmurs, gallops, or rubs.  The PMI is not displaced.   Abdominal exam reveals good bowel sounds.    Exam of the legs reveal no clubbing, cyanosis, or edema.   The distal pulses are intact.  Cranial nerves II - XII are intact.  Motor and sensory functions are intact.  The gait is normal.  LABORATORY DATA: ECG demonstrates sinus bradycardia at a rate of 59 beats per minute. There is evidence of old septal infarction. There is T wave inversion consistent with anterolateral ischemia. The T wave inversion is increased compared to last year.  Recent laboratory data from her primary M.D. showed total cholesterol 164, triglycerides 209, LDL was 86. A1c was 5.7%. Complete chemistry panel was normal.  Assessment /  Plan: 1. Coronary disease status post anterolateral myocardial infarction 2010. Status post direct stenting of the LAD with a drug-eluting stent. She remains asymptomatic. Her last stress test was in January 2011. We will schedule her for a stress Myoview study. Continue aspirin, Plavix, carvedilol, and statin therapy.  2. Ischemic cardiomyopathy. She has been intolerant of ACE inhibitors. We will continue carvedilol. She is asymptomatic.  3. Hyperlipidemia well controlled.  4. Hypertension blood pressure is well controlled.

## 2012-07-26 ENCOUNTER — Ambulatory Visit (HOSPITAL_COMMUNITY): Payer: Medicare Other | Attending: Cardiology | Admitting: Radiology

## 2012-07-26 VITALS — BP 159/79 | Ht 66.0 in | Wt 133.0 lb

## 2012-07-26 DIAGNOSIS — I252 Old myocardial infarction: Secondary | ICD-10-CM | POA: Insufficient documentation

## 2012-07-26 DIAGNOSIS — R002 Palpitations: Secondary | ICD-10-CM | POA: Insufficient documentation

## 2012-07-26 DIAGNOSIS — I491 Atrial premature depolarization: Secondary | ICD-10-CM

## 2012-07-26 DIAGNOSIS — I2510 Atherosclerotic heart disease of native coronary artery without angina pectoris: Secondary | ICD-10-CM

## 2012-07-26 DIAGNOSIS — E785 Hyperlipidemia, unspecified: Secondary | ICD-10-CM

## 2012-07-26 DIAGNOSIS — Z9861 Coronary angioplasty status: Secondary | ICD-10-CM | POA: Insufficient documentation

## 2012-07-26 DIAGNOSIS — I10 Essential (primary) hypertension: Secondary | ICD-10-CM | POA: Insufficient documentation

## 2012-07-26 DIAGNOSIS — I219 Acute myocardial infarction, unspecified: Secondary | ICD-10-CM

## 2012-07-26 DIAGNOSIS — E119 Type 2 diabetes mellitus without complications: Secondary | ICD-10-CM | POA: Insufficient documentation

## 2012-07-26 DIAGNOSIS — I255 Ischemic cardiomyopathy: Secondary | ICD-10-CM

## 2012-07-26 DIAGNOSIS — I428 Other cardiomyopathies: Secondary | ICD-10-CM | POA: Insufficient documentation

## 2012-07-26 MED ORDER — TECHNETIUM TC 99M SESTAMIBI GENERIC - CARDIOLITE
30.0000 | Freq: Once | INTRAVENOUS | Status: AC | PRN
  Administered 2012-07-26: 30 via INTRAVENOUS

## 2012-07-26 MED ORDER — TECHNETIUM TC 99M SESTAMIBI GENERIC - CARDIOLITE
10.0000 | Freq: Once | INTRAVENOUS | Status: AC | PRN
  Administered 2012-07-26: 10 via INTRAVENOUS

## 2012-07-26 NOTE — Progress Notes (Signed)
[facility] SITE 3 NUCLEAR MED [address] [place], [place] [address] [phone]    Cardiology Nuclear Med Study  [name] is a 72 y.o. female     MRN : [id number]     DOB: May 16, 1940  Procedure Date: 07/26/2012  Nuclear Med Background Indication for Stress Test:  Evaluation for Ischemia and Stent Patency History:  Cardiomyopathy, 04/2008 MI-Anterior lateral-heart Cath-Sent for stent LAD EF: 30-35% residual 70% Ostial LCFX, 70% 2nd marginal, 03/2009 MPS anterior/apical infact EF: 37%, ECHO: EF: 35-40% Cardiac Risk Factors: Hypertension, Lipids and NIDDM  Symptoms:  Palpitations   Nuclear Pre-Procedure Caffeine/Decaff Intake:  None > 12 hrs NPO After: 10:00pm   Lungs:  clear O2 Sat: 98% on room air. IV 0.9% NS with Angio Cath:  22g  IV Site: L Antecubital , tolerated well IV Started by:  Irean Hong, RN  Chest Size (in):  36 Cup Size: C  Height: 5\' 6"  (1.676 m)  Weight:  133 lb (60.328 kg)  BMI:  Body mass index is 21.48 kg/(m^2). Tech Comments:  Held Coreg x 24 hrs, and held metformin this am.    Nuclear Med Study 1 or 2 day study: 1 day  Stress Test Type:  Stress  Reading MD: Olga Millers, MD  Order Authorizing Provider:  Peter [place], MD  Resting Radionuclide: Technetium 37m Sestamibi  Resting Radionuclide Dose: 11.0 mCi   Stress Radionuclide:  Technetium 79m Sestamibi  Stress Radionuclide Dose: 32.8 mCi           Stress Protocol Rest HR: 57 Stress HR: 134  Rest BP: 159/79 Stress BP: 181/69  Exercise Time (min): 4:31 METS: 6.4   Predicted Max HR: 148 bpm % Max HR: 90.54 bpm Rate Pressure Product: 21308   Dose of Adenosine (mg):  n/a Dose of Lexiscan: n/a mg  Dose of Atropine (mg): n/a Dose of Dobutamine: n/a mcg/kg/min (at max HR)  Stress Test Technologist: Milana Na, EMT-P  Nuclear Technologist:  Domenic Polite, CNMT     Rest Procedure:  Myocardial perfusion imaging was performed at rest 45 minutes following the intravenous  administration of Technetium 36m Sestamibi. Rest ECG: Sinus bradycardia, prior septal MI, nonspecific ST changes.  Stress Procedure:  The patient exercised on the treadmill utilizing the Bruce Protocol for 4:31 minutes. The patient stopped due to fatigue, rare pacs  and denied any chest pain.  Technetium 38m Sestamibi was injected at peak exercise and myocardial perfusion imaging was performed after a brief delay. Stress ECG: No significant ST segment change suggestive of ischemia.  QPS Raw Data Images:  Acquisition technically good; LVE. Stress Images:  There is decreased uptake in the anterior wall and apex. Rest Images:  There is decreased uptake in the anterior wall and apex. Subtraction (SDS):  No evidence of ischemia. Transient Ischemic Dilatation (Normal <1.22):  1.06 Lung/Heart Ratio (Normal <0.45):  0.32  Quantitative Gated Spect Images QGS EDV:  132 ml QGS ESV:  81 ml  Impression Exercise Capacity:  Poor exercise capacity. BP Response:  Normal blood pressure response. Clinical Symptoms:  No significant symptoms noted. ECG Impression:  No significant ST segment change suggestive of ischemia. Comparison with Prior Nuclear Study: No significant change from previous study  Overall Impression:  High risk stress nuclear study with a large, severe, fixed anterior and apical defect consistent with prior infarct; no ischemia.  LV Ejection Fraction: 39%.  LV Wall Motion:  Akinesis of the anterior wall and apex.   Olga Millers

## 2013-01-10 ENCOUNTER — Encounter: Payer: Self-pay | Admitting: Cardiology

## 2013-01-10 ENCOUNTER — Ambulatory Visit (INDEPENDENT_AMBULATORY_CARE_PROVIDER_SITE_OTHER): Payer: Medicare Other | Admitting: Cardiology

## 2013-01-10 VITALS — BP 126/74 | HR 67 | Ht 66.0 in | Wt 132.8 lb

## 2013-01-10 DIAGNOSIS — E785 Hyperlipidemia, unspecified: Secondary | ICD-10-CM

## 2013-01-10 DIAGNOSIS — I10 Essential (primary) hypertension: Secondary | ICD-10-CM

## 2013-01-10 DIAGNOSIS — I2510 Atherosclerotic heart disease of native coronary artery without angina pectoris: Secondary | ICD-10-CM

## 2013-01-10 DIAGNOSIS — I2589 Other forms of chronic ischemic heart disease: Secondary | ICD-10-CM

## 2013-01-10 DIAGNOSIS — I255 Ischemic cardiomyopathy: Secondary | ICD-10-CM

## 2013-01-10 NOTE — Progress Notes (Signed)
[name] Date of Birth: 07/23/1940   History of Present Illness: [name] is seen today for followup. She has a history of coronary disease and is status post anterior lateral myocardial infarction in February 2010. She was treated with direct stenting of the mid LAD with a drug-eluting stent although her presentation was somewhat late. She also had a 70% ostial left circumflex lesion and a 70% second marginal lesion. Her last stress test in May of 2014 showed a large fixed anterior and apical defect consistent with infarction. There was no ischemia. Ejection fraction was 39%.  She continues to do very well. She denies any chest pain, shortness of breath, or palpitations. She walks daily. She continues to do her own housework and works in her garden.   Current Outpatient Prescriptions on File Prior to Visit  Medication Sig Dispense Refill  . aspirin 81 MG tablet Take 1 tablet (81 mg total) by mouth daily.  30 tablet  6  . carvedilol (COREG) 12.5 MG tablet Take 1 tablet (12.5 mg total) by mouth 2 (two) times daily.  60 tablet  11  . fish oil-omega-3 fatty acids 1000 MG capsule Take 2 g by mouth daily.        . metFORMIN (GLUCOPHAGE) 500 MG tablet Take 500 mg by mouth 2 (two) times daily. 1/2 in the morning and 1/2 in the evening      . nitroGLYCERIN (NITROSTAT) 0.4 MG SL tablet Place 1 tablet (0.4 mg total) under the tongue every 5 (five) minutes as needed.  25 tablet  3  . pravastatin (PRAVACHOL) 40 MG tablet Take 1 tablet (40 mg total) by mouth daily.  30 tablet  11   No current facility-administered medications on file prior to visit.    Allergies  Allergen Reactions  . Ace Inhibitors   . Codeine   . Procaine Hcl     Past Medical History  Diagnosis Date  . Coronary artery disease   . MI (myocardial infarction)   . Ischemic cardiomyopathy   . Diabetes mellitus   . Hyperlipidemia   . Hypertension     Past Surgical History  Procedure Laterality Date  . Cardiac  catheterization  04/29/2008    EF 30-35%  . US echocardiography  04/16/2009    EF 35-40%  . Cardiovascular stress test  04/09/2009    EF 37%    History  Smoking status  . Never Smoker   Smokeless tobacco  . Not on file    History  Alcohol Use No    History reviewed. No pertinent family history.  Review of Systems: As noted in history of present illness. She recently fractured a finger on her left hand.  All other systems were reviewed and are negative.  Physical Exam: BP 126/74  Pulse 67  Ht 5\' 6"  (1.676 m)  Wt 132 lb 12.8 oz (60.238 kg)  BMI 21.44 kg/m2  SpO2 91% She is a pleasant white female in no acute distress. The HEENT exam is normal.  The carotids are 2+ without bruits.  There is no thyromegaly.  There is no JVD.  The lungs are clear. The heart exam reveals a regular rate with a normal S1 and S2.  There are no murmurs, gallops, or rubs.  The PMI is not displaced.   Abdominal exam reveals good bowel sounds.    Exam of the legs reveal no clubbing, cyanosis, or edema.   The distal pulses are intact.  Cranial nerves II - XII are  intact.  Motor and sensory functions are intact.  The gait is normal.  LABORATORY DATA: She reports her last A1c was 5.5%.  Assessment / Plan: 1. Coronary disease status post anterolateral myocardial infarction 2010. Status post direct stenting of the LAD with a drug-eluting stent. She remains asymptomatic. Myoview study as noted above. Continue aspirin, carvedilol, and statin therapy. She may stop Plavix at this time.  2. Ischemic cardiomyopathy. She has been intolerant of ACE inhibitors. We will continue carvedilol. She is asymptomatic.  3. Hyperlipidemia well controlled.  4. Hypertension blood pressure is well controlled.

## 2013-01-10 NOTE — Patient Instructions (Signed)
Stop taking Plavix. Continue your other therapy  I will see you in 6 months.    

## 2013-07-15 ENCOUNTER — Encounter: Payer: Self-pay | Admitting: Cardiology

## 2013-07-15 ENCOUNTER — Ambulatory Visit (INDEPENDENT_AMBULATORY_CARE_PROVIDER_SITE_OTHER): Payer: Medicare Other | Admitting: Cardiology

## 2013-07-15 VITALS — BP 174/88 | HR 63 | Ht 66.0 in | Wt 135.0 lb

## 2013-07-15 DIAGNOSIS — I2589 Other forms of chronic ischemic heart disease: Secondary | ICD-10-CM

## 2013-07-15 DIAGNOSIS — I255 Ischemic cardiomyopathy: Secondary | ICD-10-CM

## 2013-07-15 DIAGNOSIS — I10 Essential (primary) hypertension: Secondary | ICD-10-CM

## 2013-07-15 DIAGNOSIS — I2510 Atherosclerotic heart disease of native coronary artery without angina pectoris: Secondary | ICD-10-CM

## 2013-07-15 DIAGNOSIS — E785 Hyperlipidemia, unspecified: Secondary | ICD-10-CM

## 2013-07-15 LAB — BASIC METABOLIC PANEL
BUN: 22 mg/dL (ref 6–23)
CALCIUM: 9.7 mg/dL (ref 8.4–10.5)
CO2: 26 mEq/L (ref 19–32)
CREATININE: 1 mg/dL (ref 0.4–1.2)
Chloride: 106 mEq/L (ref 96–112)
GFR: 57.08 mL/min — AB (ref >60.00)
GLUCOSE: 85 mg/dL (ref 70–99)
Potassium: 5.9 mEq/L — ABNORMAL HIGH (ref 3.5–5.1)
SODIUM: 142 meq/L (ref 135–145)

## 2013-07-15 NOTE — Patient Instructions (Signed)
We will check your potassium today  Continue your other medication  I will see you in 6 months

## 2013-07-15 NOTE — Progress Notes (Signed)
[name] Date of Birth: 06/14/1940   History of Present Illness: [name] is seen today for followup. She has a history of coronary disease and is status post anterior lateral myocardial infarction in February 2010. She was treated with direct stenting of the mid LAD with a drug-eluting stent although her presentation was somewhat late. She also had a 70% ostial left circumflex lesion and a 70% second marginal lesion. Her last stress test in May of 2014 showed a large fixed anterior and apical defect consistent with infarction. There was no ischemia. Ejection fraction was 39%.  She continues to do very well. She denies any chest pain, shortness of breath, or palpitations. She walks daily. She reports that she had lab work with Dr. Juanetta GoslingHawkins last week and that her potassium was very high. She was given Kayexalate. No change in diet and doesn't eat a lot of high potassium foods. On no meds that would affect her potassium. She reports her BP readings at home have been normal.  Current Outpatient Prescriptions on File Prior to Visit  Medication Sig Dispense Refill  . aspirin 81 MG tablet Take 1 tablet (81 mg total) by mouth daily.  30 tablet  6  . carvedilol (COREG) 12.5 MG tablet Take 1 tablet (12.5 mg total) by mouth 2 (two) times daily.  60 tablet  11  . fish oil-omega-3 fatty acids 1000 MG capsule Take 2 g by mouth daily.        . metFORMIN (GLUCOPHAGE) 500 MG tablet Take 500 mg by mouth 2 (two) times daily. 1/2 in the morning and 1/2 in the evening      . nitroGLYCERIN (NITROSTAT) 0.4 MG SL tablet Place 1 tablet (0.4 mg total) under the tongue every 5 (five) minutes as needed.  25 tablet  3  . pravastatin (PRAVACHOL) 40 MG tablet Take 1 tablet (40 mg total) by mouth daily.  30 tablet  11   No current facility-administered medications on file prior to visit.    Allergies  Allergen Reactions  . Ace Inhibitors   . Codeine   . Procaine Hcl     Past Medical History  Diagnosis Date  .  Coronary artery disease   . MI (myocardial infarction)   . Ischemic cardiomyopathy   . Diabetes mellitus   . Hyperlipidemia   . Hypertension   . Hyperkalemia     Past Surgical History  Procedure Laterality Date  . Cardiac catheterization  04/29/2008    EF 30-35%  . [place] echocardiography  04/16/2009    EF 35-40%  . Cardiovascular stress test  04/09/2009    EF 37%    History  Smoking status  . Never Smoker   Smokeless tobacco  . Not on file    History  Alcohol Use No    History reviewed. No pertinent family history.  Review of Systems: As noted in history of present illness.   All other systems were reviewed and are negative.  Physical Exam: BP 174/88  Pulse 63  Ht 5\' 6"  (1.676 m)  Wt 135 lb (61.236 kg)  BMI 21.80 kg/m2 She is a pleasant white female in no acute distress. The HEENT exam is normal.  The carotids are 2+ without bruits.  There is no thyromegaly.  There is no JVD.  The lungs are clear. The heart exam reveals a regular rate with a normal S1 and S2.  There are no murmurs, gallops, or rubs.  The PMI is not displaced.   Abdominal  exam reveals good bowel sounds.    Exam of the legs reveal no clubbing, cyanosis, or edema.   The distal pulses are intact.  Cranial nerves II - XII are intact.  Motor and sensory functions are intact.  The gait is normal.  LABORATORY DATA: Ecg shows NSR with RAD, old septal infarct. T wave abnormality c/w lateral ischemia. This is unchanged from prior Ecgs.   Assessment / Plan: 1. Coronary disease status post anterolateral myocardial infarction 2010. Status post direct stenting of the LAD with a drug-eluting stent. She remains asymptomatic. Myoview study as noted above. Continue aspirin, carvedilol, and statin therapy.   2. Ischemic cardiomyopathy. She has been intolerant of ACE inhibitors. We will continue carvedilol. She is asymptomatic.  3. Hyperlipidemia- requested most recent lab work  4. Hypertension apparent good control by home  BP monitor.  5. Hyperkalemia. Will repeat BMET today. Requested recent lab work. No clear reason for potassium to be high.

## 2013-07-29 ENCOUNTER — Other Ambulatory Visit: Payer: Self-pay | Admitting: Cardiology

## 2013-08-02 ENCOUNTER — Other Ambulatory Visit: Payer: Self-pay | Admitting: *Deleted

## 2013-08-02 DIAGNOSIS — I2510 Atherosclerotic heart disease of native coronary artery without angina pectoris: Secondary | ICD-10-CM

## 2013-08-02 MED ORDER — PRAVASTATIN SODIUM 40 MG PO TABS: 40.0000 mg | ORAL_TABLET | Freq: Every day | ORAL | Status: DC

## 2013-11-25 ENCOUNTER — Telehealth: Payer: Self-pay | Admitting: Cardiology

## 2013-11-25 NOTE — Telephone Encounter (Signed)
Carvedilol 12.5 mg on back-order. Authorized OK for 6.25mg  take 2 by mouth BID.

## 2013-11-25 NOTE — Telephone Encounter (Signed)
Patient's carvedilol 12.5 mg is on back order.  Pharmacy needs to know what to do.

## 2013-12-02 ENCOUNTER — Inpatient Hospital Stay (HOSPITAL_COMMUNITY)
Admission: EM | Admit: 2013-12-02 | Discharge: 2013-12-15 | DRG: 064 | Disposition: E | Payer: Medicare Other | Attending: Family Medicine | Admitting: Family Medicine

## 2013-12-02 ENCOUNTER — Emergency Department (HOSPITAL_COMMUNITY): Payer: Medicare Other

## 2013-12-02 ENCOUNTER — Encounter (HOSPITAL_COMMUNITY): Payer: Self-pay | Admitting: Emergency Medicine

## 2013-12-02 DIAGNOSIS — R063 Periodic breathing: Secondary | ICD-10-CM | POA: Diagnosis not present

## 2013-12-02 DIAGNOSIS — Z66 Do not resuscitate: Secondary | ICD-10-CM | POA: Diagnosis present

## 2013-12-02 DIAGNOSIS — I255 Ischemic cardiomyopathy: Secondary | ICD-10-CM | POA: Diagnosis present

## 2013-12-02 DIAGNOSIS — IMO0001 Reserved for inherently not codable concepts without codable children: Secondary | ICD-10-CM | POA: Diagnosis present

## 2013-12-02 DIAGNOSIS — I6330 Cerebral infarction due to thrombosis of unspecified cerebral artery: Principal | ICD-10-CM | POA: Diagnosis present

## 2013-12-02 DIAGNOSIS — Z888 Allergy status to other drugs, medicaments and biological substances status: Secondary | ICD-10-CM

## 2013-12-02 DIAGNOSIS — R4182 Altered mental status, unspecified: Secondary | ICD-10-CM | POA: Diagnosis present

## 2013-12-02 DIAGNOSIS — E1165 Type 2 diabetes mellitus with hyperglycemia: Secondary | ICD-10-CM

## 2013-12-02 DIAGNOSIS — R001 Bradycardia, unspecified: Secondary | ICD-10-CM

## 2013-12-02 DIAGNOSIS — G8190 Hemiplegia, unspecified affecting unspecified side: Secondary | ICD-10-CM | POA: Diagnosis present

## 2013-12-02 DIAGNOSIS — I2510 Atherosclerotic heart disease of native coronary artery without angina pectoris: Secondary | ICD-10-CM | POA: Diagnosis present

## 2013-12-02 DIAGNOSIS — Z515 Encounter for palliative care: Secondary | ICD-10-CM | POA: Diagnosis not present

## 2013-12-02 DIAGNOSIS — I252 Old myocardial infarction: Secondary | ICD-10-CM

## 2013-12-02 DIAGNOSIS — R4020 Unspecified coma: Secondary | ICD-10-CM | POA: Diagnosis not present

## 2013-12-02 DIAGNOSIS — I10 Essential (primary) hypertension: Secondary | ICD-10-CM | POA: Diagnosis present

## 2013-12-02 DIAGNOSIS — I2589 Other forms of chronic ischemic heart disease: Secondary | ICD-10-CM | POA: Diagnosis present

## 2013-12-02 DIAGNOSIS — I6350 Cerebral infarction due to unspecified occlusion or stenosis of unspecified cerebral artery: Secondary | ICD-10-CM

## 2013-12-02 DIAGNOSIS — Z885 Allergy status to narcotic agent status: Secondary | ICD-10-CM | POA: Diagnosis not present

## 2013-12-02 DIAGNOSIS — E119 Type 2 diabetes mellitus without complications: Secondary | ICD-10-CM

## 2013-12-02 DIAGNOSIS — Z23 Encounter for immunization: Secondary | ICD-10-CM | POA: Diagnosis not present

## 2013-12-02 DIAGNOSIS — Z7982 Long term (current) use of aspirin: Secondary | ICD-10-CM | POA: Diagnosis not present

## 2013-12-02 DIAGNOSIS — Z9861 Coronary angioplasty status: Secondary | ICD-10-CM | POA: Diagnosis not present

## 2013-12-02 DIAGNOSIS — R4701 Aphasia: Secondary | ICD-10-CM | POA: Diagnosis present

## 2013-12-02 DIAGNOSIS — I63512 Cerebral infarction due to unspecified occlusion or stenosis of left middle cerebral artery: Secondary | ICD-10-CM

## 2013-12-02 DIAGNOSIS — E785 Hyperlipidemia, unspecified: Secondary | ICD-10-CM | POA: Diagnosis present

## 2013-12-02 DIAGNOSIS — G9340 Encephalopathy, unspecified: Secondary | ICD-10-CM | POA: Diagnosis present

## 2013-12-02 DIAGNOSIS — I639 Cerebral infarction, unspecified: Secondary | ICD-10-CM | POA: Diagnosis present

## 2013-12-02 LAB — RAPID URINE DRUG SCREEN, HOSP PERFORMED
Amphetamines: NOT DETECTED
BARBITURATES: NOT DETECTED
BENZODIAZEPINES: NOT DETECTED
Cocaine: NOT DETECTED
Opiates: NOT DETECTED
Tetrahydrocannabinol: NOT DETECTED

## 2013-12-02 LAB — COMPREHENSIVE METABOLIC PANEL
ALBUMIN: 3.8 g/dL (ref 3.5–5.2)
ALK PHOS: 66 U/L (ref 39–117)
ALT: 20 U/L (ref 0–35)
AST: 24 U/L (ref 0–37)
Anion gap: 10 (ref 5–15)
BILIRUBIN TOTAL: 0.4 mg/dL (ref 0.3–1.2)
BUN: 21 mg/dL (ref 6–23)
CHLORIDE: 106 meq/L (ref 96–112)
CO2: 25 mEq/L (ref 19–32)
Calcium: 8.3 mg/dL — ABNORMAL LOW (ref 8.4–10.5)
Creatinine, Ser: 0.86 mg/dL (ref 0.50–1.10)
GFR calc Af Amer: 76 mL/min — ABNORMAL LOW (ref >90)
GFR calc non Af Amer: 65 mL/min — ABNORMAL LOW (ref >90)
Glucose, Bld: 165 mg/dL — ABNORMAL HIGH (ref 70–99)
POTASSIUM: 4.7 meq/L (ref 3.7–5.3)
SODIUM: 141 meq/L (ref 137–147)
Total Protein: 7.1 g/dL (ref 6.0–8.3)

## 2013-12-02 LAB — I-STAT CHEM 8, ED
BUN: 21 mg/dL (ref 6–23)
CALCIUM ION: 1.13 mmol/L (ref 1.13–1.30)
CHLORIDE: 106 meq/L (ref 96–112)
CREATININE: 0.9 mg/dL (ref 0.50–1.10)
GLUCOSE: 163 mg/dL — AB (ref 70–99)
HCT: 41 % (ref 36.0–46.0)
Hemoglobin: 13.9 g/dL (ref 12.0–15.0)
Potassium: 4.3 mEq/L (ref 3.7–5.3)
Sodium: 140 mEq/L (ref 137–147)
TCO2: 25 mmol/L (ref 0–100)

## 2013-12-02 LAB — URINE MICROSCOPIC-ADD ON

## 2013-12-02 LAB — CBC
HCT: 39.7 % (ref 36.0–46.0)
Hemoglobin: 12.9 g/dL (ref 12.0–15.0)
MCH: 30.1 pg (ref 26.0–34.0)
MCHC: 32.5 g/dL (ref 30.0–36.0)
MCV: 92.5 fL (ref 78.0–100.0)
PLATELETS: 155 10*3/uL (ref 150–400)
RBC: 4.29 MIL/uL (ref 3.87–5.11)
RDW: 12.6 % (ref 11.5–15.5)
WBC: 13.1 10*3/uL — AB (ref 4.0–10.5)

## 2013-12-02 LAB — SALICYLATE LEVEL: Salicylate Lvl: <2 mg/dL — ABNORMAL LOW (ref 2.8–20.0)

## 2013-12-02 LAB — URINALYSIS, ROUTINE W REFLEX MICROSCOPIC
Bilirubin Urine: NEGATIVE
GLUCOSE, UA: 100 mg/dL — AB
Hgb urine dipstick: NEGATIVE
Ketones, ur: NEGATIVE mg/dL
NITRITE: NEGATIVE
PH: 6.5 (ref 5.0–8.0)
Protein, ur: NEGATIVE mg/dL
SPECIFIC GRAVITY, URINE: 1.014 (ref 1.005–1.030)
Urobilinogen, UA: 0.2 mg/dL (ref 0.0–1.0)

## 2013-12-02 LAB — MRSA PCR SCREENING: MRSA by PCR: NEGATIVE

## 2013-12-02 LAB — ACETAMINOPHEN LEVEL: Acetaminophen (Tylenol), Serum: <15 ug/mL (ref 10–30)

## 2013-12-02 LAB — ETHANOL: Alcohol, Ethyl (B): <11 mg/dL (ref 0–11)

## 2013-12-02 LAB — GLUCOSE, CAPILLARY: Glucose-Capillary: 162 mg/dL — ABNORMAL HIGH (ref 70–99)

## 2013-12-02 LAB — LACTIC ACID, PLASMA: LACTIC ACID, VENOUS: 1.6 mmol/L (ref 0.5–2.2)

## 2013-12-02 LAB — TROPONIN I

## 2013-12-02 MED ORDER — INFLUENZA VAC SPLIT QUAD 0.5 ML IM SUSY
0.5000 mL | PREFILLED_SYRINGE | Freq: Once | INTRAMUSCULAR | Status: DC
  Filled 2013-12-02: qty 0.5

## 2013-12-02 MED ORDER — ONDANSETRON HCL 4 MG/2ML IJ SOLN
4.0000 mg | Freq: Once | INTRAMUSCULAR | Status: AC
  Administered 2013-12-02: 4 mg via INTRAVENOUS
  Filled 2013-12-02: qty 2

## 2013-12-02 MED ORDER — STROKE: EARLY STAGES OF RECOVERY BOOK
Freq: Once | Status: AC
  Administered 2013-12-02: 21:00:00
  Filled 2013-12-02: qty 1

## 2013-12-02 MED ORDER — SODIUM CHLORIDE 0.9 % IV SOLN
INTRAVENOUS | Status: AC
  Administered 2013-12-02: 21:00:00 via INTRAVENOUS

## 2013-12-02 MED ORDER — SODIUM CHLORIDE 0.9 % IV BOLUS (SEPSIS)
500.0000 mL | Freq: Once | INTRAVENOUS | Status: AC
  Administered 2013-12-02: 500 mL via INTRAVENOUS

## 2013-12-02 MED ORDER — ASPIRIN 300 MG RE SUPP
300.0000 mg | Freq: Every day | RECTAL | Status: DC
  Administered 2013-12-02 – 2013-12-03 (×2): 300 mg via RECTAL
  Filled 2013-12-02 (×2): qty 1

## 2013-12-02 MED ORDER — PANTOPRAZOLE SODIUM 40 MG IV SOLR
40.0000 mg | INTRAVENOUS | Status: DC
  Administered 2013-12-02: 40 mg via INTRAVENOUS
  Filled 2013-12-02 (×3): qty 40

## 2013-12-02 MED ORDER — INSULIN ASPART 100 UNIT/ML ~~LOC~~ SOLN
0.0000 [IU] | SUBCUTANEOUS | Status: DC
  Administered 2013-12-02: 2 [IU] via SUBCUTANEOUS
  Administered 2013-12-03 (×2): 1 [IU] via SUBCUTANEOUS
  Administered 2013-12-03: 2 [IU] via SUBCUTANEOUS
  Administered 2013-12-03: 3 [IU] via SUBCUTANEOUS
  Administered 2013-12-03: 1 [IU] via SUBCUTANEOUS

## 2013-12-02 MED ORDER — INFLUENZA VAC SPLIT QUAD 0.5 ML IM SUSY
0.5000 mL | PREFILLED_SYRINGE | INTRAMUSCULAR | Status: AC
  Administered 2013-12-03: 0.5 mL via INTRAMUSCULAR
  Filled 2013-12-02: qty 0.5

## 2013-12-02 MED ORDER — ACETAMINOPHEN 650 MG RE SUPP: 650.0000 mg | RECTAL | Status: DC | PRN

## 2013-12-02 MED ORDER — ONDANSETRON HCL 4 MG/2ML IJ SOLN
4.0000 mg | Freq: Four times a day (QID) | INTRAMUSCULAR | Status: DC | PRN
  Filled 2013-12-02: qty 2

## 2013-12-02 NOTE — ED Notes (Signed)
Bed: RESA Expected date:  Expected time:  Means of arrival:  Comments: EMS/A.M.S. 

## 2013-12-02 NOTE — H&P (Signed)
History and Physical  [name] XBJ:[id number] DOB: 09-20-1940 DOA: 12/07/2013  Referring physician: Dr. Cathren Laine, EDP PCP: Fredirick Maudlin, MD  Outpatient Specialists:  1. Cardiology: Dr. Peter Swaziland  Chief Complaint: Altered mental status.  HPI: [name] is a 73 y.o. female with history of CAD, s/p MI 04/2008, stenting of mid LAD with DES, last stress test in May 2014 showed large fixed anterior and apical defect consistent with infarction but no ischemia, EF 39%, ischemic cardiomyopathy, HLD, HTN, type II DM and recent issues with hyperkalemia, was brought to the ED by EMS with altered mental status. Patient unable to provide history secondary to obtundation. History obtained from patient's son and daughter at bedside. Last seen well : Not known. Last heard well: 12/01/13 at approximately 7 PM. Son spoke to mother last night when she appeared well without complaints. He called her on 9/18 at about 9 AM and did not get a response. He went to her house and found the front door open. He found the patient lying on the living room floor on her right side with emesis and bowel incontinence but no bleeding. She was barely responsive and open her eyes transiently. He did not appreciate facial asymmetry. EMS was activated and patient was brought to the ED where CT head without contrast shows large left MCA territory infarct with possible thrombus in the proximal left MCA, no appreciable midline shift or hemorrhage. Neurology was consulted by EDP and recommend transferring to [facility]. Hospitalists were consulted for admission.    Review of Systems: All systems reviewed and apart from history of presenting illness, are negative.  Past Medical History  Diagnosis Date  . Coronary artery disease   . MI (myocardial infarction)   . Ischemic cardiomyopathy   . Diabetes mellitus   . Hyperlipidemia   . Hypertension   . Hyperkalemia    Past Surgical History  Procedure Laterality Date  .  Cardiac catheterization  04/29/2008    EF 30-35%  . US echocardiography  04/16/2009    EF 35-40%  . Cardiovascular stress test  04/09/2009    EF 37%  . Cholecystectomy    . Abdominal hysterectomy     Social History:  reports that she has never smoked. She has never used smokeless tobacco. She reports that she does not drink alcohol or use illicit drugs.  Widowed. Lives alone. Physically very active and independent of activities of daily living.   Allergies  Allergen Reactions  . Ace Inhibitors     Unknown   . Codeine     Unknown   . Procaine Hcl     Unknown     History reviewed. No pertinent family history. negative family history.  Prior to Admission medications   Medication Sig Start Date End Date Taking? Authorizing Provider  aspirin EC 81 MG tablet Take 81 mg by mouth daily.   Yes Historical Provider, MD  carvedilol (COREG) 6.25 MG tablet Take 12.5 mg by mouth 2 (two) times daily with a meal.   Yes Historical Provider, MD  fish oil-omega-3 fatty acids 1000 MG capsule Take 2 g by mouth daily.     Yes Historical Provider, MD  metFORMIN (GLUCOPHAGE) 500 MG tablet Take 250 mg by mouth 2 (two) times daily. 1/2 in the morning and 1/2 in the evening   Yes Historical Provider, MD  nitroGLYCERIN (NITROSTAT) 0.4 MG SL tablet Place 1 tablet (0.4 mg total) under the tongue every 5 (five) minutes as needed. 07/12/12  Yes Peter M Swaziland, MD  Omega-3 Fatty Acids (OMEGA 3 PO) Take 1 tablet by mouth daily.   Yes Historical Provider, MD  pravastatin (PRAVACHOL) 40 MG tablet Take 40 mg by mouth daily.   Yes Historical Provider, MD   Physical Exam: Filed Vitals:   11/16/2013 1158 12/10/2013 1200 11/25/2013 1215 11/16/2013 1315  BP: 159/55 154/64 117/50 120/91  Pulse: 48 52 58 63  Temp: 97.3 F (36.3 C)     TempSrc: Rectal     Resp: SpO2: 100% 100% 100% 100%     General exam: Moderately built and nourished elderly female patient, lying comfortably on the gurney in right lateral  position, in no obvious distress.  Head, eyes and ENT: Nontraumatic and normocephalic. Pupils pinpoint bilaterally. Eyes deviated to left but no nystagmus Oral mucosa moist. No appreciable facial asymmetry   Neck: Supple. No JVD, carotid bruit or thyromegaly.  Lymphatics: No lymphadenopathy.  Respiratory system: Clear to auscultation. No increased work of breathing. Able to protect airway currently. Yawning intermittently.   Cardiovascular system: S1 and S2 heard, RRR. No JVD, murmurs, gallops, clicks or pedal edema.  Gastrointestinal system: Abdomen is nondistended, soft and nontender. Normal bowel sounds heard. No organomegaly or masses appreciated.  Central nervous system:  drowsy. Occasionally will open eyes transiently. Nonverbal. Does not follow instructions. Pinpoint pupils bilaterally with conjugate deviation of eyes to left without nystagmus. No clear facial asymmetry. Did not check gag reflex secondary to recent emesis.  Extremities:  purposefully moves left upper extremity and left lower extremity. Flaccid RUE with 0/5 power. 1/5 power in right lower extremity. Peripheral pulses symmetrically felt.   Skin: No rashes or acute findings.  Musculoskeletal system: Negative exam.  Psychiatry:  unable to assess secondary to altered mental status.    Labs on Admission:  Basic Metabolic Panel:  Recent Labs Lab 12/01/2013 1235 12/11/2013 1329  NA 141 140  K 4.7 4.3  CL 106 106  CO2 25  --   GLUCOSE 165* 163*  BUN 21 21  CREATININE 0.86 0.90  CALCIUM 8.3*  --    Liver Function Tests:  Recent Labs Lab 12/12/2013 1235  AST 24  ALT 20  ALKPHOS 66  BILITOT 0.4  PROT 7.1  ALBUMIN 3.8   No results found for this basename: LIPASE, AMYLASE,  in the last 168 hours No results found for this basename: AMMONIA,  in the last 168 hours CBC:  Recent Labs Lab 11/18/2013 1235 12/14/2013 1329  WBC 13.1*  --   HGB 12.9 13.9  HCT 39.7 41.0  MCV 92.5  --   PLT 155  --    Cardiac  Enzymes:  Recent Labs Lab 11/22/2013 1235  TROPONINI <0.30    BNP (last 3 results) No results found for this basename: PROBNP,  in the last 8760 hours CBG: No results found for this basename: GLUCAP,  in the last 168 hours  Radiological Exams on Admission: Ct Head Wo Contrast  11/30/2013   CLINICAL DATA:  Pain and altered mental status post trauma  EXAM: CT HEAD WITHOUT CONTRAST  CT CERVICAL SPINE WITHOUT CONTRAST  TECHNIQUE: Multidetector CT imaging of the head and cervical spine was performed following the standard protocol without intravenous contrast. Multiplanar CT image reconstructions of the cervical spine were also generated.  COMPARISON:  None.  FINDINGS: CT HEAD FINDINGS  The ventricles are normal in size and configuration. The right lateral ventricle is slightly larger than the left lateral ventricle, a  probable anatomic variant.  There is no mass, hemorrhage, extra-axial fluid collection, or midline shift.  There is decreased attenuation throughout much of the left temporal lobe as well as portions of the left claustrum, extreme capsule, and insular cortex consistent with an acute infarct involving a portion of the left middle cerebral artery distribution with associated cytotoxic edema. No other focal infarct is seen. Elsewhere gray-white compartments appear unremarkable. Bony calvarium appears intact. The mastoid air cells are clear.  CT CERVICAL SPINE FINDINGS  There is no fracture or spondylolisthesis. Prevertebral soft tissues and predental space regions are normal. There is moderate disc space narrowing at C3-4. There is mild disc space narrowing at C5-6. There is facet hypertrophy at multiple levels bilaterally. No nerve root edema or effacement. No disc extrusion or stenosis.  IMPRESSION: CT head: There is a fairly sizable acute infarct involving much of the left middle cerebral artery distribution. No hemorrhage or extra-axial fluid. No appreciable midline shift. Note that on axial  slice 9 series 3, there is a focal area of increased attenuation which may represent a localized area of thrombus in the proximal most aspect of the left middle cerebral artery. This increased attenuation does not extend throughout the left middle cerebral artery, however.  CT cervical spine: Areas of osteoarthritic change. No disc extrusion or stenosis. No fracture or spondylolisthesis.  These results were called by telephone at the time of interpretation on 11/20/2013 at 12:57 pm to Dr. Cathren Laine , who verbally acknowledged these results.   Electronically Signed   By: Bretta Bang M.D.   On: 12/13/2013 12:57   Ct Cervical Spine Wo Contrast  11/18/2013   CLINICAL DATA:  Pain and altered mental status post trauma  EXAM: CT HEAD WITHOUT CONTRAST  CT CERVICAL SPINE WITHOUT CONTRAST  TECHNIQUE: Multidetector CT imaging of the head and cervical spine was performed following the standard protocol without intravenous contrast. Multiplanar CT image reconstructions of the cervical spine were also generated.  COMPARISON:  None.  FINDINGS: CT HEAD FINDINGS  The ventricles are normal in size and configuration. The right lateral ventricle is slightly larger than the left lateral ventricle, a probable anatomic variant.  There is no mass, hemorrhage, extra-axial fluid collection, or midline shift.  There is decreased attenuation throughout much of the left temporal lobe as well as portions of the left claustrum, extreme capsule, and insular cortex consistent with an acute infarct involving a portion of the left middle cerebral artery distribution with associated cytotoxic edema. No other focal infarct is seen. Elsewhere gray-white compartments appear unremarkable. Bony calvarium appears intact. The mastoid air cells are clear.  CT CERVICAL SPINE FINDINGS  There is no fracture or spondylolisthesis. Prevertebral soft tissues and predental space regions are normal. There is moderate disc space narrowing at C3-4. There is  mild disc space narrowing at C5-6. There is facet hypertrophy at multiple levels bilaterally. No nerve root edema or effacement. No disc extrusion or stenosis.  IMPRESSION: CT head: There is a fairly sizable acute infarct involving much of the left middle cerebral artery distribution. No hemorrhage or extra-axial fluid. No appreciable midline shift. Note that on axial slice 9 series 3, there is a focal area of increased attenuation which may represent a localized area of thrombus in the proximal most aspect of the left middle cerebral artery. This increased attenuation does not extend throughout the left middle cerebral artery, however.  CT cervical spine: Areas of osteoarthritic change. No disc extrusion or stenosis. No fracture or spondylolisthesis.  These results were called by telephone at the time of interpretation on 12/09/2013 at 12:57 pm to Dr. Cathren Laine , who verbally acknowledged these results.   Electronically Signed   By: Bretta Bang M.D.   On: 12/13/2013 12:57   Dg Chest Port 1 View  11/30/2013   CLINICAL DATA:  SOB, UNRESPONSIVE  EXAM: PORTABLE CHEST - 1 VIEW  COMPARISON:  04/29/2008  FINDINGS: The heart size and mediastinal contours are within normal limits. Both lungs are clear. The visualized skeletal structures are unremarkable.  IMPRESSION: No active disease.   Electronically Signed   By: Elige Ko   On: 11/24/2013 12:52    EKG: Independently reviewed.  sinus bradycardia at 51 beats per minute, T wave inversion in inferior and lateral leads. Q waves in leads V1-3.  Assessment/Plan Principal Problem:   CVA (cerebral infarction)- Large Acute Left MCA territory infart. Active Problems:   Coronary artery disease   Ischemic cardiomyopathy   Hyperlipidemia   Hypertension   Diabetes mellitus type 2, noninsulin dependent   Acute encephalopathy   1. Acute large left MCA territory infarct: Likely secondary to left MCA thrombus. Patient presented to [facility] ED. She will be  transferred to step down unit at [facility] for close monitoring, evaluation and management by neurology/stroke team. Out of window for TPA. On aspirin 81 mg daily PTA. Stroke risk factors: DM, HTN, HLD. N.p.o. secondary to drowsiness. Complete stroke workup-MRI and MRA brain, 2-D echo, carotid Dopplers, fasting lipids and hemoglobin A1c. PT, OT and ST evaluation when able. Elevate head end of bed 30. Neurology consultation-discussed with Dr. Roseanne Reno. Avoid heparin for DVT prophylaxis due to risk for hemorrhagic transformation. Frequent neuro checks and close monitoring. 2. Hypertension: Controlled. Allow for permissive hypertension. Hold antihypertensives. 3. Uncontrolled Type II DM: NovoLog SSI. Hold metformin. 4. HLD: Hold all by mouth meds until patient alert and passes swallow evaluation. Then may start back statins. 5. History of CAD/ischemic cardiomyopathy: Rectal aspirin. Holding beta blockers to allow for permissive hypertension. Patient not on ACE inhibitor/ARB-probably DC'd secondary to issues with hyperkalemia. Troponin x1 negative. 6. Acute encephalopathy: Secondary to problem #1. Currently able to protect airway. Monitor closely and if she declines, transfer to ICU and consult critical care team.     Code Status:  Full  Family Communication:  discussed with patient's son and daughter at bedside.  Disposition Plan:  patient seen in [place] long ED. Transfer to [facility] step down unit.  Time spent:  70 minutes  HONGALGI,ANAND, MD, FACP, FHM. [facility] Pager [phone]  If 7PM-7AM, please contact night-coverage [email or web address] Password [email or web address] 12/14/2013, 2:51 PM

## 2013-12-02 NOTE — ED Notes (Signed)
MD at bedside. 

## 2013-12-02 NOTE — ED Notes (Signed)
Per EMS, pt from home.  Pt found by family this morning on floor.  Pt had deficated/urinated on self.  Also vomiting.  Pt was slightly arouseable but did not follow command.  Gave resistance to attempts to get her to open eyes.  Started 20 g RAC in route.  Narcan  given.  No change in alertness.  Pt did respond to Ammonia.  Vitals: 131/69, hr 58, 98%ra, cbg 132, resp 18.

## 2013-12-02 NOTE — ED Notes (Signed)
Notified Dr. Denton Lank of patient condition on assessment.  Dr. Denton Lank signing for patient to see.

## 2013-12-02 NOTE — ED Notes (Signed)
Pt had emesis episode, suction turned on and used, emesis basin placed and pt face and hands washes with wash rag.

## 2013-12-02 NOTE — ED Notes (Signed)
Rafael Bihari (son) [phone] Sedonia Small (dtr) [phone]

## 2013-12-02 NOTE — Progress Notes (Signed)
[facility] progress note. Chief complaint. Transfer note. History of present illness. This 73 year old female presented to [facility] long emergency room with altered mental status. CT scan of the head showed a large left MCA territory infarct with possible thrombus in the proximal left MCA. Neurology was consult by the emergency room physician and recommended transfer to [facility]. The patient is now arrived and I'm seeing her at bedside to ensure she remains stable. I find an elderly female with her eyes closed and apparently nonverbal. No evidence of distress. Vital signs. Temperature 97.3, pulse 60, respiration 13, blood pressure 146/66. O2 sats 93%. General appearance. Frail elderly female who is apparently alert and she follows commands but remains with her eyes closed and nonverbal to questions. Cardiac. Rate and rhythm regular. Lungs. Breath sounds clear and equal. Abdomen. Soft with positive bowel sounds. No pain with palpation. Neurologic. Patient nonverbal. I did not check gag reflex. She does follow commands during the course of examination. She appears to have dense right hemiparesis of upper and lower extremity. Toes appear left and downgoing right. Impression/plan. Problem #1. Acute large left MCA territory infarct. Patient out of window for TPA. Complete stroke workup with pending MRI/MRA, 2-D echocardiogram, carotid Doppler, lipids, and hemoglobin A1c. I will notify neurology if patient's arrival. Weight heparin due to the risk of hemorrhagic transformation. Problem #2. Hypertension. Controlled. Permissive hypertension. Antihypertensives held. Problem #3. Diabetes. Sliding scale NovoLog Patient appears clinically stable post transfer. All orders appear to have transferred appropriately.

## 2013-12-02 NOTE — ED Notes (Signed)
Report given to [facility] 

## 2013-12-02 NOTE — ED Notes (Signed)
Pt not responding to staff.  Unable to do stroke swallow screen.

## 2013-12-02 NOTE — ED Notes (Signed)
Pt taken to CT/Xray exam.

## 2013-12-02 NOTE — ED Provider Notes (Signed)
CSN: [id number]     Arrival date & time 12-18-13  1134 History   First MD Initiated Contact with Patient Dec 18, 2013 1213     Chief Complaint  Patient presents with  . Altered Mental Status     (Consider location/radiation/quality/duration/timing/severity/associated sxs/prior Treatment) Patient is a 73 y.o. female presenting with altered mental status. The history is provided by the patient, a relative and the EMS personnel. The history is limited by the condition of the patient.  Altered Mental Status pt from home via ems.  Was found on floor of home by family member who had gone to check on her, w emesis about her, and had urinated and defecated on self.  Pt responded minimally. ems notes cbg 132. ems gave narcan w no significant change in mental status.  Pt not verbally responsive on arrival to ED - level 5 caveat.  Family had spoken w yesterday, and pt seemed her normal self, without any physical c/o.     Past Medical History  Diagnosis Date  . Coronary artery disease   . MI (myocardial infarction)   . Ischemic cardiomyopathy   . Diabetes mellitus   . Hyperlipidemia   . Hypertension   . Hyperkalemia    Past Surgical History  Procedure Laterality Date  . Cardiac catheterization  04/29/2008    EF 30-35%  . US echocardiography  04/16/2009    EF 35-40%  . Cardiovascular stress test  04/09/2009    EF 37%   History reviewed. No pertinent family history. History  Substance Use Topics  . Smoking status: Never Smoker   . Smokeless tobacco: Not on file  . Alcohol Use: No   OB History   Grav Para Term Preterm Abortions TAB SAB Ect Mult Living                 Review of Systems  Unable to perform ROS: Patient unresponsive  level 5 caveat.     Allergies  Ace inhibitors; Codeine; and Procaine hcl  Home Medications   Prior to Admission medications   Medication Sig Start Date End Date Taking? Authorizing Provider  aspirin 81 MG tablet Take 1 tablet (81 mg total) by mouth  daily. 12/22/11   Peter M [place], MD  carvedilol (COREG) 6.25 MG tablet Take 12.5 mg by mouth 2 (two) times daily with a meal.    Historical Provider, MD  fish oil-omega-3 fatty acids 1000 MG capsule Take 2 g by mouth daily.      Historical Provider, MD  metFORMIN (GLUCOPHAGE) 500 MG tablet Take 500 mg by mouth 2 (two) times daily. 1/2 in the morning and 1/2 in the evening    Historical Provider, MD  nitroGLYCERIN (NITROSTAT) 0.4 MG SL tablet Place 1 tablet (0.4 mg total) under the tongue every 5 (five) minutes as needed. 07/12/12   Peter M [place], MD  pravastatin (PRAVACHOL) 40 MG tablet Take 1 tablet (40 mg total) by mouth daily. 08/02/13   Peter M [place], MD   BP 159/55  Pulse 48  Temp(Src) 97.3 F (36.3 C) (Rectal)  Resp 18  SpO2 100% Physical Exam  Nursing note and vitals reviewed. Constitutional: She appears well-developed and well-nourished. No distress.  HENT:  Head: Atraumatic.  Mouth/Throat: Oropharynx is clear and moist.  Eyes: Conjunctivae are normal. No scleral icterus.  Pupils miotic.   Neck: Neck supple. No tracheal deviation present.  Cardiovascular: Regular rhythm, normal heart sounds and intact distal pulses.   Pulmonary/Chest: Effort normal and breath sounds normal. No  respiratory distress.  Abdominal: Soft. Normal appearance and bowel sounds are normal. She exhibits no distension. There is no tenderness.  Genitourinary:  No cva tenderness  Musculoskeletal: She exhibits no edema and no tenderness.  Neurological:  Pt lying on side on stretcher, shifts her position/head position, although wont follow commands or respond verbally. Pinpoint pupils.   Skin: Skin is warm. No rash noted. She is not diaphoretic.  Psychiatric:  Pt altered/unresponsive.      ED Course  Procedures (including critical care time) Labs Review  Results for orders placed during the hospital encounter of 11/21/2013  CBC      Result Value Ref Range   WBC 13.1 (*) 4.0 - 10.5 K/uL   RBC 4.29   3.87 - 5.11 MIL/uL   Hemoglobin 12.9  12.0 - 15.0 g/dL   HCT 16.1  09.6 - 04.5 %   MCV 92.5  78.0 - 100.0 fL   MCH 30.1  26.0 - 34.0 pg   MCHC 32.5  30.0 - 36.0 g/dL   RDW 40.9  81.1 - 91.4 %   Platelets 155  150 - 400 K/uL  COMPREHENSIVE METABOLIC PANEL      Result Value Ref Range   Sodium 141  137 - 147 mEq/L   Potassium 4.7  3.7 - 5.3 mEq/L   Chloride 106  96 - 112 mEq/L   CO2 25  19 - 32 mEq/L   Glucose, Bld 165 (*) 70 - 99 mg/dL   BUN 21  6 - 23 mg/dL   Creatinine, Ser 7.82  0.50 - 1.10 mg/dL   Calcium 8.3 (*) 8.4 - 10.5 mg/dL   Total Protein 7.1  6.0 - 8.3 g/dL   Albumin 3.8  3.5 - 5.2 g/dL   AST 24  0 - 37 U/L   ALT 20  0 - 35 U/L   Alkaline Phosphatase 66  39 - 117 U/L   Total Bilirubin 0.4  0.3 - 1.2 mg/dL   GFR calc non Af Amer 65 (*) >90 mL/min   GFR calc Af Amer 76 (*) >90 mL/min   Anion gap 10  5 - 15  LACTIC ACID, PLASMA      Result Value Ref Range   Lactic Acid, Venous 1.6  0.5 - 2.2 mmol/L  TROPONIN I      Result Value Ref Range   Troponin I <0.30  <0.30 ng/mL  ETHANOL      Result Value Ref Range   Alcohol, Ethyl (B) <11  0 - 11 mg/dL  ACETAMINOPHEN LEVEL      Result Value Ref Range   Acetaminophen (Tylenol), Serum <15.0  10 - 30 ug/mL  SALICYLATE LEVEL      Result Value Ref Range   Salicylate Lvl <2.0 (*) 2.8 - 20.0 mg/dL  I-STAT CHEM 8, ED      Result Value Ref Range   Sodium 140  137 - 147 mEq/L   Potassium 4.3  3.7 - 5.3 mEq/L   Chloride 106  96 - 112 mEq/L   BUN 21  6 - 23 mg/dL   Creatinine, Ser 9.56  0.50 - 1.10 mg/dL   Glucose, Bld 213 (*) 70 - 99 mg/dL   Calcium, Ion 0.86  5.78 - 1.30 mmol/L   TCO2 25  0 - 100 mmol/L   Hemoglobin 13.9  12.0 - 15.0 g/dL   HCT 46.9  62.9 - 52.8 %   Ct Head Wo Contrast  12/11/2013   CLINICAL DATA:  Pain  and altered mental status post trauma  EXAM: CT HEAD WITHOUT CONTRAST  CT CERVICAL SPINE WITHOUT CONTRAST  TECHNIQUE: Multidetector CT imaging of the head and cervical spine was performed following the  standard protocol without intravenous contrast. Multiplanar CT image reconstructions of the cervical spine were also generated.  COMPARISON:  None.  FINDINGS: CT HEAD FINDINGS  The ventricles are normal in size and configuration. The right lateral ventricle is slightly larger than the left lateral ventricle, a probable anatomic variant.  There is no mass, hemorrhage, extra-axial fluid collection, or midline shift.  There is decreased attenuation throughout much of the left temporal lobe as well as portions of the left claustrum, extreme capsule, and insular cortex consistent with an acute infarct involving a portion of the left middle cerebral artery distribution with associated cytotoxic edema. No other focal infarct is seen. Elsewhere gray-white compartments appear unremarkable. Bony calvarium appears intact. The mastoid air cells are clear.  CT CERVICAL SPINE FINDINGS  There is no fracture or spondylolisthesis. Prevertebral soft tissues and predental space regions are normal. There is moderate disc space narrowing at C3-4. There is mild disc space narrowing at C5-6. There is facet hypertrophy at multiple levels bilaterally. No nerve root edema or effacement. No disc extrusion or stenosis.  IMPRESSION: CT head: There is a fairly sizable acute infarct involving much of the left middle cerebral artery distribution. No hemorrhage or extra-axial fluid. No appreciable midline shift. Note that on axial slice 9 series 3, there is a focal area of increased attenuation which may represent a localized area of thrombus in the proximal most aspect of the left middle cerebral artery. This increased attenuation does not extend throughout the left middle cerebral artery, however.  CT cervical spine: Areas of osteoarthritic change. No disc extrusion or stenosis. No fracture or spondylolisthesis.  These results were called by telephone at the time of interpretation on 2013/12/30 at 12:57 pm to Dr. Cathren Laine , who verbally  acknowledged these results.   Electronically Signed   By: Bretta Bang M.D.   On: December 30, 2013 12:57   Ct Cervical Spine Wo Contrast  12-30-13   CLINICAL DATA:  Pain and altered mental status post trauma  EXAM: CT HEAD WITHOUT CONTRAST  CT CERVICAL SPINE WITHOUT CONTRAST  TECHNIQUE: Multidetector CT imaging of the head and cervical spine was performed following the standard protocol without intravenous contrast. Multiplanar CT image reconstructions of the cervical spine were also generated.  COMPARISON:  None.  FINDINGS: CT HEAD FINDINGS  The ventricles are normal in size and configuration. The right lateral ventricle is slightly larger than the left lateral ventricle, a probable anatomic variant.  There is no mass, hemorrhage, extra-axial fluid collection, or midline shift.  There is decreased attenuation throughout much of the left temporal lobe as well as portions of the left claustrum, extreme capsule, and insular cortex consistent with an acute infarct involving a portion of the left middle cerebral artery distribution with associated cytotoxic edema. No other focal infarct is seen. Elsewhere gray-white compartments appear unremarkable. Bony calvarium appears intact. The mastoid air cells are clear.  CT CERVICAL SPINE FINDINGS  There is no fracture or spondylolisthesis. Prevertebral soft tissues and predental space regions are normal. There is moderate disc space narrowing at C3-4. There is mild disc space narrowing at C5-6. There is facet hypertrophy at multiple levels bilaterally. No nerve root edema or effacement. No disc extrusion or stenosis.  IMPRESSION: CT head: There is a fairly sizable acute infarct involving much of  the left middle cerebral artery distribution. No hemorrhage or extra-axial fluid. No appreciable midline shift. Note that on axial slice 9 series 3, there is a focal area of increased attenuation which may represent a localized area of thrombus in the proximal most aspect of the  left middle cerebral artery. This increased attenuation does not extend throughout the left middle cerebral artery, however.  CT cervical spine: Areas of osteoarthritic change. No disc extrusion or stenosis. No fracture or spondylolisthesis.  These results were called by telephone at the time of interpretation on 11/15/2013 at 12:57 pm to Dr. Cathren Laine , who verbally acknowledged these results.   Electronically Signed   By: Bretta Bang M.D.   On: 11/20/2013 12:57   Dg Chest Port 1 View  11/15/2013   CLINICAL DATA:  SOB, UNRESPONSIVE  EXAM: PORTABLE CHEST - 1 VIEW  COMPARISON:  04/29/2008  FINDINGS: The heart size and mediastinal contours are within normal limits. Both lungs are clear. The visualized skeletal structures are unremarkable.  IMPRESSION: No active disease.   Electronically Signed   By: Elige Ko   On: 12/06/2013 12:52      EKG Interpretation   Date/Time:  Friday December 02 2013 11:54:19 EDT Ventricular Rate:  51 PR Interval:  183 QRS Duration: 78 QT Interval:  449 QTC Calculation: 413 R Axis:   91 Text Interpretation:  Sinus bradycardia Nonspecific T wave abnormality No  significant change since last tracing Confirmed by Denton Lank  MD, Caryn Bee  ([id number]) on 12/13/2013 1:34:16 PM      MDM   Iv ns. Continuous pulse ox and monitor. Stat ct.  Reviewed nursing notes and prior charts for additional history.   Radiology called re large mca stroke.  Pt was last seen heard from when normal yesterday.  Was not seen or talked to at all today until found on ground.   Neurology consulted, spoke with Dr Roseanne Reno - he will consult, and wants transferred to [facility], states contact hospitalist to admit.  hospitalist paged to admit.  Frequent rechecks and repeat neuro checks, no change from initial exam.  Family notified, and updated on patients condition.  CRITICAL CARE  RE acute Left MCA CVA, altered mental status/unresponsiveness. Performed by: Suzi Roots Total critical  care time: 40 Critical care time was exclusive of separately billable procedures and treating other patients. Critical care was necessary to treat or prevent imminent or life-threatening deterioration. Critical care was time spent personally by me on the following activities: development of treatment plan with patient and/or surrogate as well as nursing, discussions with consultants, evaluation of patient's response to treatment, examination of patient, obtaining history from patient or surrogate, ordering and performing treatments and interventions, ordering and review of laboratory studies, ordering and review of radiographic studies, pulse oximetry and re-evaluation of patient's condition.      Suzi Roots, MD 11/20/2013 1336

## 2013-12-02 NOTE — ED Notes (Signed)
Pt has been NPO since arrival to ED.  Unable to do stroke swallow screen.

## 2013-12-02 NOTE — ED Notes (Signed)
Unable to give report.  Gave my name and number to call back.

## 2013-12-02 NOTE — ED Notes (Signed)
Shena, Grandaughter took pt ring, clothes and bag of meds.

## 2013-12-02 NOTE — ED Notes (Signed)
Pt very lethargic and pushing against staff while attempting to change clothing and clean.  Large amount of stool found on patient.

## 2013-12-02 NOTE — ED Notes (Signed)
Attempted to call report to [facility] - Transferred with no answer.

## 2013-12-03 ENCOUNTER — Inpatient Hospital Stay (HOSPITAL_COMMUNITY): Payer: Medicare Other

## 2013-12-03 ENCOUNTER — Encounter (HOSPITAL_COMMUNITY): Payer: Self-pay

## 2013-12-03 DIAGNOSIS — I6330 Cerebral infarction due to thrombosis of unspecified cerebral artery: Secondary | ICD-10-CM | POA: Diagnosis not present

## 2013-12-03 DIAGNOSIS — I059 Rheumatic mitral valve disease, unspecified: Secondary | ICD-10-CM

## 2013-12-03 DIAGNOSIS — I6350 Cerebral infarction due to unspecified occlusion or stenosis of unspecified cerebral artery: Secondary | ICD-10-CM

## 2013-12-03 LAB — LIPID PANEL
CHOL/HDL RATIO: 3.5 ratio
Cholesterol: 142 mg/dL (ref 0–200)
HDL: 41 mg/dL (ref >39)
LDL Cholesterol: 78 mg/dL (ref 0–99)
Triglycerides: 115 mg/dL (ref <150)
VLDL: 23 mg/dL (ref 0–40)

## 2013-12-03 LAB — SODIUM: Sodium: 136 mEq/L — ABNORMAL LOW (ref 137–147)

## 2013-12-03 LAB — HEMOGLOBIN A1C
Hgb A1c MFr Bld: 6 % — ABNORMAL HIGH (ref <5.7)
Mean Plasma Glucose: 126 mg/dL — ABNORMAL HIGH (ref <117)

## 2013-12-03 LAB — GLUCOSE, CAPILLARY
GLUCOSE-CAPILLARY: 150 mg/dL — AB (ref 70–99)
GLUCOSE-CAPILLARY: 205 mg/dL — AB (ref 70–99)
Glucose-Capillary: 147 mg/dL — ABNORMAL HIGH (ref 70–99)
Glucose-Capillary: 151 mg/dL — ABNORMAL HIGH (ref 70–99)
Glucose-Capillary: 120 mg/dL — ABNORMAL HIGH (ref 70–99)
Glucose-Capillary: 121 mg/dL — ABNORMAL HIGH (ref 70–99)

## 2013-12-03 MED ORDER — SODIUM CHLORIDE 3 % IV SOLN
INTRAVENOUS | Status: DC
  Filled 2013-12-03 (×6): qty 500

## 2013-12-03 NOTE — Progress Notes (Signed)
  Echocardiogram 2D Echocardiogram has been performed.  [name] 12/03/2013, 12:24 PM

## 2013-12-03 NOTE — Consult Note (Signed)
PULMONARY  / CRITICAL CARE MEDICINE CONSULTATION  Name: [name] MRN: [id number] DOB: 03-Aug-1940  PHYSICIAN REQUESTING CONSULT: [name]  REASON FOR CONSULT: critical care, including airway/vent management     ADMISSION DATE:  11/21/2013  CHIEF COMPLAINT:  CVA  BRIEF PATIENT DESCRIPTION: 73 y/o woman with history of ischemic cardiomyopathy admitted with large left MCA stroke with MRI showing midline shift.    SIGNIFICANT EVENTS / STUDIES:  1. 11/17/2013 admitted with AMS - CT head showing large acute LMCA infarct, no hemorrhage or shift 2. 12/03/13 worsening mental status, irregular heart rate and breathing --> initiates transfer to ICU, NSG consult 3. 12/03/13 MRI shows swelling and 9.5 mm of left-to-right shift  LINES / TUBES: 1. PIVs  CULTURES: 1. None  ANTIBIOTICS: 1. None  HISTORY OF PRESENT ILLNESS:   [name] is a 73 year old woman with history of CAD (last DES stent 2010), DM, and ischemic cardiomyopathy with EF ~35%, who was admitted on 9/18 after son found her down.  CT head showed a large L MCA infarct with no hemorrhage, but she was outside of the window for TPA.  On admission, she was noted to be somewhat interactive per admission note and per her son, with attempts to talk.  Today, she became much more sleepy per her family and began to have irregular breathing.  MRI of the brain showed that she had a new midline shift, and CCM was consulted to place central line and possibly intubate.  At this time the patient is unable to give history, and the family states that she had no complaints prior to this event while she was at home. Of note, prior to this the patient was walking every day for exercise and was a very important/active part of her family.  Her sons and daughters are at bedside.   PAST MEDICAL HISTORY :  Past Medical History  Diagnosis Date  . Coronary artery disease   . MI (myocardial infarction)   . Ischemic cardiomyopathy   . Diabetes mellitus    . Hyperlipidemia   . Hypertension   . Hyperkalemia     Past Surgical History  Procedure Laterality Date  . Cardiac catheterization  04/29/2008    EF 30-35%  . US echocardiography  04/16/2009    EF 35-40%  . Cardiovascular stress test  04/09/2009    EF 37%  . Cholecystectomy    . Abdominal hysterectomy      MEDICATIONS: Scheduled Meds: . aspirin  300 mg Rectal Daily  . insulin aspart  0-9 Units Subcutaneous 6 times per day  . pantoprazole (PROTONIX) IV  40 mg Intravenous Q24H   Continuous Infusions: . sodium chloride (hypertonic)     PRN Meds:.acetaminophen, ondansetron (ZOFRAN) IV  ALLERGIES: Allergies  Allergen Reactions  . Ace Inhibitors     Unknown   . Codeine     Unknown   . Procaine Hcl     Unknown     FAMILY HISTORY: Unable to obtain due to patient's condition  SOCIAL HISTORY: Unable to obtain due to patient's condition  REVIEW OF SYSTEMS:  Unable to obtain 12 point review of systems due to patient's condition  PHYSICAL EXAM VITAL SIGNS: Temp:  [97.1 F (36.2 C)-99 F (37.2 C)] 98.8 F (37.1 C) (09/19 1625) Pulse Rate:  [51-81] 57 (09/19 1625) Resp:  [15-26] 20 (09/19 1625) BP: (121-166)/(50-84) 166/84 mmHg (09/19 1625) SpO2:  [94 %-97 %] 96 % (09/19 1625) Weight:  [133 lb 6.1 oz (60.5 kg)]  133 lb 6.1 oz (60.5 kg) (09/18 2015)  HEMODYNAMICS:    VENTILATOR SETTINGS:   : General:  Somnolent, unresponsive to verbal commands.  Neuro:  Groans to painful stimuli. Moving both legs and left arm - all extremities are rigid.  Both pupils 1mm and sluggishly reactive.  HEENT:  Trachea midline. Eyes anicteric. Mucus membranes dry.  Neck:  Supple; JVP 2cm above clavicle. No lymphadenopathy Cardiovascular:  Tachycardic to 120s alternating with bradycardia to 40s, regular Lungs:  Coarse breath sounds throughout; cheyne stokes breathing evident Abdomen:  Soft, nontender, nondistended. Hypoactive bowel sounds Musculoskeletal:  No joint abnormalities; no  deformities of limb; muscle tone increased throughout Skin:  No rashes/lesions or skin breakdown.  INS/OUTS: Intake/Output     09/19 0701 - 09/20 0700   I.V. (mL/kg) 150 (2.5)   Total Intake(mL/kg) 150 (2.5)   Net +150       Urine Occurrence 1 x     CLINICAL DECISION MAKING  CBC Recent Labs     12/08/2013  1235  12/13/2013  1329  WBC  13.1*   --   HGB  12.9  13.9  HCT  39.7  41.0  PLT  155   --     Coag's No results found for this basename: APTT, INR,  in the last 72 hours  BMET Recent Labs     11/15/2013  1235  11/15/2013  1329  NA  141  140  K  4.7  4.3  CL  106  106  CO2  25   --   BUN  21  21  CREATININE  0.86  0.90  GLUCOSE  165*  163*    Electrolytes Recent Labs     12/06/2013  1235  CALCIUM  8.3*    Sepsis Markers No results found for this basename: LACTICACIDVEN, PROCALCITON, O2SATVEN,  in the last 72 hours  ABG No results found for this basename: PHART, PCO2ART, PO2ART,  in the last 72 hours  Liver Enzymes Recent Labs     11/19/2013  1235  AST  24  ALT  20  ALKPHOS  66  BILITOT  0.4  ALBUMIN  3.8    Cardiac Enzymes Recent Labs     11/22/2013  1235  TROPONINI  <0.30    Glucose Recent Labs     11/28/2013  2216  12/03/13  0035  12/03/13  0339  12/03/13  0835  12/03/13  1224  12/03/13  1623  GLUCAP  162*  150*  147*  151*  120*  121*    Imaging Ct Head Wo Contrast  12/03/2013   CLINICAL DATA:  Followup stroke  EXAM: CT HEAD WITHOUT CONTRAST  TECHNIQUE: Contiguous axial images were obtained from the base of the skull through the vertex without intravenous contrast.  COMPARISON:  11/19/2013  FINDINGS: There is progressive low-density affecting the left temporal lobe, deep insula, frontoparietal region and parietal lobe, the region measuring 9 x 6 x 7.5 cm, consistent with acute infarction. The deep brain is also affected in the region of the left lateral thalamus, basal ganglia and posterior limb internal capsule. Swelling is more pronounced  with more mass effect upon the left lateral ventricle and flattening of that structure. Left-to-right midline shift measures 7 mm. There is no hemorrhage. No other focus of infarction is evident. No evidence of neoplastic mass lesion, obstructive hydrocephalus or extra-axial collection. No calvarial abnormality. Atherosclerotic calcification affects the major vessels at the base of the brain. Visualized sinuses are clear.  IMPRESSION: Acute infarction involving 70% of the left MCA territory as outlined above. Increasing low density and swelling. More mass effect upon the left lateral ventricle and left-to-right shift of 7 mm. No hemorrhage.   Electronically Signed   By: Paulina Fusi M.D.   On: 12/03/2013 09:56   Ct Head Wo Contrast  11/23/2013   CLINICAL DATA:  Pain and altered mental status post trauma  EXAM: CT HEAD WITHOUT CONTRAST  CT CERVICAL SPINE WITHOUT CONTRAST  TECHNIQUE: Multidetector CT imaging of the head and cervical spine was performed following the standard protocol without intravenous contrast. Multiplanar CT image reconstructions of the cervical spine were also generated.  COMPARISON:  None.  FINDINGS: CT HEAD FINDINGS  The ventricles are normal in size and configuration. The right lateral ventricle is slightly larger than the left lateral ventricle, a probable anatomic variant.  There is no mass, hemorrhage, extra-axial fluid collection, or midline shift.  There is decreased attenuation throughout much of the left temporal lobe as well as portions of the left claustrum, extreme capsule, and insular cortex consistent with an acute infarct involving a portion of the left middle cerebral artery distribution with associated cytotoxic edema. No other focal infarct is seen. Elsewhere gray-white compartments appear unremarkable. Bony calvarium appears intact. The mastoid air cells are clear.  CT CERVICAL SPINE FINDINGS  There is no fracture or spondylolisthesis. Prevertebral soft tissues and predental  space regions are normal. There is moderate disc space narrowing at C3-4. There is mild disc space narrowing at C5-6. There is facet hypertrophy at multiple levels bilaterally. No nerve root edema or effacement. No disc extrusion or stenosis.  IMPRESSION: CT head: There is a fairly sizable acute infarct involving much of the left middle cerebral artery distribution. No hemorrhage or extra-axial fluid. No appreciable midline shift. Note that on axial slice 9 series 3, there is a focal area of increased attenuation which may represent a localized area of thrombus in the proximal most aspect of the left middle cerebral artery. This increased attenuation does not extend throughout the left middle cerebral artery, however.  CT cervical spine: Areas of osteoarthritic change. No disc extrusion or stenosis. No fracture or spondylolisthesis.  These results were called by telephone at the time of interpretation on 11/18/2013 at 12:57 pm to Dr. Cathren Laine , who verbally acknowledged these results.   Electronically Signed   By: Bretta Bang M.D.   On: 11/25/2013 12:57   Ct Cervical Spine Wo Contrast  11/27/2013   CLINICAL DATA:  Pain and altered mental status post trauma  EXAM: CT HEAD WITHOUT CONTRAST  CT CERVICAL SPINE WITHOUT CONTRAST  TECHNIQUE: Multidetector CT imaging of the head and cervical spine was performed following the standard protocol without intravenous contrast. Multiplanar CT image reconstructions of the cervical spine were also generated.  COMPARISON:  None.  FINDINGS: CT HEAD FINDINGS  The ventricles are normal in size and configuration. The right lateral ventricle is slightly larger than the left lateral ventricle, a probable anatomic variant.  There is no mass, hemorrhage, extra-axial fluid collection, or midline shift.  There is decreased attenuation throughout much of the left temporal lobe as well as portions of the left claustrum, extreme capsule, and insular cortex consistent with an acute  infarct involving a portion of the left middle cerebral artery distribution with associated cytotoxic edema. No other focal infarct is seen. Elsewhere gray-white compartments appear unremarkable. Bony calvarium appears intact. The mastoid air cells are clear.  CT CERVICAL SPINE FINDINGS  There is no fracture or spondylolisthesis. Prevertebral soft tissues and predental space regions are normal. There is moderate disc space narrowing at C3-4. There is mild disc space narrowing at C5-6. There is facet hypertrophy at multiple levels bilaterally. No nerve root edema or effacement. No disc extrusion or stenosis.  IMPRESSION: CT head: There is a fairly sizable acute infarct involving much of the left middle cerebral artery distribution. No hemorrhage or extra-axial fluid. No appreciable midline shift. Note that on axial slice 9 series 3, there is a focal area of increased attenuation which may represent a localized area of thrombus in the proximal most aspect of the left middle cerebral artery. This increased attenuation does not extend throughout the left middle cerebral artery, however.  CT cervical spine: Areas of osteoarthritic change. No disc extrusion or stenosis. No fracture or spondylolisthesis.  These results were called by telephone at the time of interpretation on 11/24/2013 at 12:57 pm to Dr. Cathren Laine , who verbally acknowledged these results.   Electronically Signed   By: Bretta Bang M.D.   On: 11/24/2013 12:57   Mr Brain Wo Contrast  12/03/2013   CLINICAL DATA:  Right sided weakness.  Left MCA infarction.  EXAM: MRI HEAD WITHOUT CONTRAST  MRA HEAD WITHOUT CONTRAST  TECHNIQUE: Multiplanar, multiecho pulse sequences of the brain and surrounding structures were obtained without intravenous contrast. Angiographic images of the head were obtained using MRA technique without contrast.  COMPARISON:  Multiple head CT studies most recently earlier same day.  FINDINGS: MRI HEAD FINDINGS  The study suffers  from motion degradation. There is acute infarction on the left affecting the entire PCA territory and approximately 50% of the MCA territory. There is involvement of the left temporal lobe, left occipital lobe, left thalamus, posterior insular region, posterior frontal region and parietal region. The area of infarction shows swelling and mass effect and there is left right midline shift measuring 9.5 mm. There is no evidence of hemorrhagic transformation. No other acute infarction is seen. There is no hydrocephalus. No extra-axial collection. No pituitary mass. No inflammatory disease.  MRA HEAD FINDINGS  This examination is severely motion degraded. The left internal carotid artery does not show antegrade flow. The right internal carotid artery is patent through the skullbase and supplies the right middle cerebral artery territory, both anterior cerebral artery territories, and gives some supply to the left middle cerebral artery territory.  Both vertebral arteries show flow to the basilar. The basilar artery is patent. There is very limited information regarding posterior circulation branch vessel flow. I see flow in the right posterior cerebral artery but not in the left, not surprising given the area of infarction. Presumably, the left posterior cerebral artery type a fetal origin from the anterior circulation.  IMPRESSION: Acute infarction on the left throughout the left PCA territory and the posterior portion of the left MCA territory. The brain shows swelling and 9.5 mm of left-to-right shift. No sign of hemorrhage at this moment. 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 The right ICA is patent and supplies the right anterior and middle cerebral artery territory and portions of the right middle cerebral artery territory through a patent anterior communicating artery. Presumably, the left PCA took a fetal origin from the anterior circulation. Detail and quality of  the information from the MR angiogram is markedly limited by patient motion.   Electronically Signed   By: Scherrie Bateman.D.  On: 12/03/2013 18:39   Dg Chest Port 1 View  11/29/2013   CLINICAL DATA:  SOB, UNRESPONSIVE  EXAM: PORTABLE CHEST - 1 VIEW  COMPARISON:  04/29/2008  FINDINGS: The heart size and mediastinal contours are within normal limits. Both lungs are clear. The visualized skeletal structures are unremarkable.  IMPRESSION: No active disease.   Electronically Signed   By: Elige Ko   On: 11/25/2013 12:52   Mr Maxine Glenn Head/brain Wo Cm  12/03/2013   CLINICAL DATA:  Right sided weakness.  Left MCA infarction.  EXAM: MRI HEAD WITHOUT CONTRAST  MRA HEAD WITHOUT CONTRAST  TECHNIQUE: Multiplanar, multiecho pulse sequences of the brain and surrounding structures were obtained without intravenous contrast. Angiographic images of the head were obtained using MRA technique without contrast.  COMPARISON:  Multiple head CT studies most recently earlier same day.  FINDINGS: MRI HEAD FINDINGS  The study suffers from motion degradation. There is acute infarction on the left affecting the entire PCA territory and approximately 50% of the MCA territory. There is involvement of the left temporal lobe, left occipital lobe, left thalamus, posterior insular region, posterior frontal region and parietal region. The area of infarction shows swelling and mass effect and there is left right midline shift measuring 9.5 mm. There is no evidence of hemorrhagic transformation. No other acute infarction is seen. There is no hydrocephalus. No extra-axial collection. No pituitary mass. No inflammatory disease.  MRA HEAD FINDINGS  This examination is severely motion degraded. The left internal carotid artery does not show antegrade flow. The right internal carotid artery is patent through the skullbase and supplies the right middle cerebral artery territory, both anterior cerebral artery territories, and gives some supply to the left  middle cerebral artery territory.  Both vertebral arteries show flow to the basilar. The basilar artery is patent. There is very limited information regarding posterior circulation branch vessel flow. I see flow in the right posterior cerebral artery but not in the left, not surprising given the area of infarction. Presumably, the left posterior cerebral artery type a fetal origin from the anterior circulation.  IMPRESSION: Acute infarction on the left throughout the left PCA territory and the posterior portion of the left MCA territory. The brain shows swelling and 9.5 mm of left-to-right shift. No sign of hemorrhage at this moment. 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 The right ICA is patent and supplies the right anterior and middle cerebral artery territory and portions of the right middle cerebral artery territory through a patent anterior communicating artery. Presumably, the left PCA took a fetal origin from the anterior circulation. Detail and quality of the information from the MR angiogram is markedly limited by patient motion.   Electronically Signed   By: Paulina Fusi M.D.   On: 12/03/2013 18:39   MRI brain: Acute infarction on the left throughout the left PCA territory and  the posterior portion of the left MCA territory. The brain shows  swelling and 9.5 mm of left-to-right shift. No sign of hemorrhage at  this moment. There is occlusion of the left ICA in the neck with no  antegrade flow at the skullbase. The right ICA is patent and  supplies the right anterior and middle cerebral artery territory and  portions of the right middle cerebral artery territory through a  patent anterior communicating artery. Presumably, the left PCA took  a fetal origin from the anterior circulation. Detail and quality of  the information from the MR angiogram is markedly limited by patient  motion.   EKG: Sinus bradycardia, poor R wave progression,  inverted T waves in V5, V6 CXR: clear chest xr, personally reviewed  ASSESSMENT / PLAN: Principal Problem:   CVA (cerebral infarction)- Large Acute Left MCA territory infart. Active Problems:   Coronary artery disease   Ischemic cardiomyopathy   Hyperlipidemia   Hypertension   Diabetes mellitus type 2, noninsulin dependent   Acute encephalopathy   CVA (cerebral vascular accident)   Per neurosurgery and neurology, the risks of surgical intervention outweigh possible benefits, and the family did not elect to go forward with decompressive craniectomy. After discussions with the patient's sons and daughters, they also did NOT want to do intubation, central line, and 3% saline.  They strongly expressed a desire to make her comfortable instead, as they did not feel that her quality of life after this would be acceptable to her if she were to survive this hospitalization.  I spent over an hour in discussions with multiple family members. She will be DNR/DNI and we will defer to her primary team for further comfort care management.   I have personally obtained a history, examined the patient, evaluated laboratory and imaging results, formulated the assessment and plan and placed orders.  Pershing Cox, MD Pulmonary and Critical Care Medicine [facility] Pager: [phone]  12/03/2013, 8:10 PM

## 2013-12-03 NOTE — Progress Notes (Signed)
Neurology MD's at the bedside speaking with family. Patient has obtained a Neuro ICU Bed. Report was called at 2020. Charge Rn for [facility] will transfer patient up to Neuro ICU as soon as family and MD's are finished discussing patients care. [name] 12/03/2013 9:24 PM

## 2013-12-03 NOTE — Progress Notes (Signed)
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 - Pt on aspirin - Neurologist on board and managing. Complete work up pending  Active Problems:   Coronary artery disease - stable    Ischemic cardiomyopathy - stable, troponin negative    Hyperlipidemia - LDL < 100    Hypertension - recent stroke therefore allowing permissive HTN    Diabetes mellitus type 2, noninsulin dependent - Will continue to monitor blood sugars - diabetic diet once patient passes swallow evaluation   Code Status: full Family Communication: None at bedside Disposition Plan: Pending further recommendations from PT and Neurologist   Consultants:  Neurology  Procedures:  Please refer to orders  Antibiotics:  None  HPI/Subjective: Pt has no new complaints. No acute issues reported overnight.  Objective: Filed Vitals:   12/03/13 0839  BP: 158/63  Pulse: 62  Temp: 97.4 F (36.3 C)  Resp: 20    Intake/Output Summary (Last 24 hours) at 12/03/13 1028 Last data filed at 12/10/2013 2300  Gross per 24 hour  Intake    115 ml  Output      0 ml  Net    115 ml   Filed Weights   11/24/2013 2015  Weight: 60.5 kg (133 lb 6.1 oz)    Exam:   General:  Pt in nad, somnolent but arrousable  Cardiovascular: rrr, no murmurs  Respiratory: no increased wob, no wheezes  Abdomen: soft, Nt, nd  Musculoskeletal: no cyanosis or clubbing  Neuro: Right hemiparesis   Data Reviewed: Basic Metabolic Panel:  Recent Labs Lab 12/12/2013 1235 11/29/2013 1329  NA 141 140  K 4.7 4.3  CL 106 106  CO2 25  --   GLUCOSE 165* 163*  BUN 21 21  CREATININE 0.86 0.90  CALCIUM 8.3*  --    Liver Function Tests:  Recent Labs Lab 12/14/2013 1235  AST 24  ALT 20  ALKPHOS 66  BILITOT 0.4  PROT 7.1  ALBUMIN 3.8   No results  found for this basename: LIPASE, AMYLASE,  in the last 168 hours No results found for this basename: AMMONIA,  in the last 168 hours CBC:  Recent Labs Lab 11/22/2013 1235 12/14/2013 1329  WBC 13.1*  --   HGB 12.9 13.9  HCT 39.7 41.0  MCV 92.5  --   PLT 155  --    Cardiac Enzymes:  Recent Labs Lab 12/13/2013 1235  TROPONINI <0.30   BNP (last 3 results) No results found for this basename: PROBNP,  in the last 8760 hours CBG:  Recent Labs Lab 12/06/2013 2216 12/03/13 0035 12/03/13 0339 12/03/13 0835  GLUCAP 162* 150* 147* 151*    Recent Results (from the past 240 hour(s))  MRSA PCR SCREENING     Status: None   Collection Time    12/03/2013  8:26 PM      Result Value Ref Range Status   MRSA by PCR NEGATIVE  NEGATIVE Final   Comment:            The GeneXpert MRSA Assay (FDA     approved for NASAL specimens     only), is one component of a     comprehensive MRSA colonization     surveillance program. It is not     intended to diagnose MRSA     infection nor to guide or  monitor treatment for     MRSA infections.     Studies: Ct Head Wo Contrast  12/03/2013   CLINICAL DATA:  Followup stroke  EXAM: CT HEAD WITHOUT CONTRAST  TECHNIQUE: Contiguous axial images were obtained from the base of the skull through the vertex without intravenous contrast.  COMPARISON:  Dec 13, 2013  FINDINGS: There is progressive low-density affecting the left temporal lobe, deep insula, frontoparietal region and parietal lobe, the region measuring 9 x 6 x 7.5 cm, consistent with acute infarction. The deep brain is also affected in the region of the left lateral thalamus, basal ganglia and posterior limb internal capsule. Swelling is more pronounced with more mass effect upon the left lateral ventricle and flattening of that structure. Left-to-right midline shift measures 7 mm. There is no hemorrhage. No other focus of infarction is evident. No evidence of neoplastic mass lesion, obstructive hydrocephalus  or extra-axial collection. No calvarial abnormality. Atherosclerotic calcification affects the major vessels at the base of the brain. Visualized sinuses are clear.  IMPRESSION: Acute infarction involving 70% of the left MCA territory as outlined above. Increasing low density and swelling. More mass effect upon the left lateral ventricle and left-to-right shift of 7 mm. No hemorrhage.   Electronically Signed   By: Paulina Fusi M.D.   On: 12/03/2013 09:56   Ct Head Wo Contrast  12-13-13   CLINICAL DATA:  Pain and altered mental status post trauma  EXAM: CT HEAD WITHOUT CONTRAST  CT CERVICAL SPINE WITHOUT CONTRAST  TECHNIQUE: Multidetector CT imaging of the head and cervical spine was performed following the standard protocol without intravenous contrast. Multiplanar CT image reconstructions of the cervical spine were also generated.  COMPARISON:  None.  FINDINGS: CT HEAD FINDINGS  The ventricles are normal in size and configuration. The right lateral ventricle is slightly larger than the left lateral ventricle, a probable anatomic variant.  There is no mass, hemorrhage, extra-axial fluid collection, or midline shift.  There is decreased attenuation throughout much of the left temporal lobe as well as portions of the left claustrum, extreme capsule, and insular cortex consistent with an acute infarct involving a portion of the left middle cerebral artery distribution with associated cytotoxic edema. No other focal infarct is seen. Elsewhere gray-white compartments appear unremarkable. Bony calvarium appears intact. The mastoid air cells are clear.  CT CERVICAL SPINE FINDINGS  There is no fracture or spondylolisthesis. Prevertebral soft tissues and predental space regions are normal. There is moderate disc space narrowing at C3-4. There is mild disc space narrowing at C5-6. There is facet hypertrophy at multiple levels bilaterally. No nerve root edema or effacement. No disc extrusion or stenosis.  IMPRESSION: CT  head: There is a fairly sizable acute infarct involving much of the left middle cerebral artery distribution. No hemorrhage or extra-axial fluid. No appreciable midline shift. Note that on axial slice 9 series 3, there is a focal area of increased attenuation which may represent a localized area of thrombus in the proximal most aspect of the left middle cerebral artery. This increased attenuation does not extend throughout the left middle cerebral artery, however.  CT cervical spine: Areas of osteoarthritic change. No disc extrusion or stenosis. No fracture or spondylolisthesis.  These results were called by telephone at the time of interpretation on 13-Dec-2013 at 12:57 pm to Dr. Cathren Laine , who verbally acknowledged these results.   Electronically Signed   By: Bretta Bang M.D.   On: 13-Dec-2013 12:57   Ct Cervical Spine Wo Contrast  12/13/2013   CLINICAL DATA:  Pain and altered mental status post trauma  EXAM: CT HEAD WITHOUT CONTRAST  CT CERVICAL SPINE WITHOUT CONTRAST  TECHNIQUE: Multidetector CT imaging of the head and cervical spine was performed following the standard protocol without intravenous contrast. Multiplanar CT image reconstructions of the cervical spine were also generated.  COMPARISON:  None.  FINDINGS: CT HEAD FINDINGS  The ventricles are normal in size and configuration. The right lateral ventricle is slightly larger than the left lateral ventricle, a probable anatomic variant.  There is no mass, hemorrhage, extra-axial fluid collection, or midline shift.  There is decreased attenuation throughout much of the left temporal lobe as well as portions of the left claustrum, extreme capsule, and insular cortex consistent with an acute infarct involving a portion of the left middle cerebral artery distribution with associated cytotoxic edema. No other focal infarct is seen. Elsewhere gray-white compartments appear unremarkable. Bony calvarium appears intact. The mastoid air cells are clear.   CT CERVICAL SPINE FINDINGS  There is no fracture or spondylolisthesis. Prevertebral soft tissues and predental space regions are normal. There is moderate disc space narrowing at C3-4. There is mild disc space narrowing at C5-6. There is facet hypertrophy at multiple levels bilaterally. No nerve root edema or effacement. No disc extrusion or stenosis.  IMPRESSION: CT head: There is a fairly sizable acute infarct involving much of the left middle cerebral artery distribution. No hemorrhage or extra-axial fluid. No appreciable midline shift. Note that on axial slice 9 series 3, there is a focal area of increased attenuation which may represent a localized area of thrombus in the proximal most aspect of the left middle cerebral artery. This increased attenuation does not extend throughout the left middle cerebral artery, however.  CT cervical spine: Areas of osteoarthritic change. No disc extrusion or stenosis. No fracture or spondylolisthesis.  These results were called by telephone at the time of interpretation on 12/07/2013 at 12:57 pm to Dr. Cathren Laine , who verbally acknowledged these results.   Electronically Signed   By: Bretta Bang M.D.   On: 12/01/2013 12:57   Dg Chest Port 1 View  11/30/2013   CLINICAL DATA:  SOB, UNRESPONSIVE  EXAM: PORTABLE CHEST - 1 VIEW  COMPARISON:  04/29/2008  FINDINGS: The heart size and mediastinal contours are within normal limits. Both lungs are clear. The visualized skeletal structures are unremarkable.  IMPRESSION: No active disease.   Electronically Signed   By: Elige Ko   On: 12/07/2013 12:52    Scheduled Meds: . aspirin  300 mg Rectal Daily  . Influenza vac split quadrivalent PF  0.5 mL Intramuscular Tomorrow-1000  . insulin aspart  0-9 Units Subcutaneous 6 times per day  . pantoprazole (PROTONIX) IV  40 mg Intravenous Q24H   Continuous Infusions: . sodium chloride 50 mL/hr at 12/06/2013 2042      Time spent: > 35 minutes    Penny Pia  [facility] Pager [phone] If 7PM-7AM, please contact night-coverage at [email or web address], password [facility] 12/03/2013, 10:28 AM  LOS: 1 day

## 2013-12-03 NOTE — Progress Notes (Signed)
SLP Cancellation Note  Patient Details Name: KORRINA ZERN MRN: [id number] DOB: 30-Sep-1940   Cancelled treatment:       Pt unable to be seen d/t being transported to radiology; prior to transport, SLP attempted to arouse pt, but unable to with multiple attempts; spoke with family friend re: possible evaluation being completed on 12/08/2013   Lajarvis Italiano,PAT 12/03/2013, 2:40 PM

## 2013-12-03 NOTE — Consult Note (Signed)
Neurology Consultation Reason for Consult: Stroke Referring Physician: Hongolgi, A  CC: Aphasia  History is obtained from: Family  HPI: [name] is a 73 y.o. female who was last in her normal state of health on 9/17, around 7:30 PM when family talk to her. Today, she did not answer when they called, he then found her on the living room floor in her pajamas. EMS was called and she was brought to [facility]. CT head showed a large left MCA territory infarct.   LKW: 7:30 PM 9/17 tpa given?: no, outside of window    ROS:  Unable to obtain due to altered mental status.   Past Medical History  Diagnosis Date  . Coronary artery disease   . MI (myocardial infarction)   . Ischemic cardiomyopathy   . Diabetes mellitus   . Hyperlipidemia   . Hypertension   . Hyperkalemia     Family History: No history of strokes  Social History: Tob: Denies  Exam: Current vital signs: BP 150/67  Pulse 73  Temp(Src) 98.5 F (36.9 C) (Axillary)  Resp 18  Ht  (1.676 m)  Wt 60.5 kg (133 lb 6.1 oz)  BMI 21.54 kg/m2  SpO2 97% Vital signs in last 24 hours: Temp:  [97.3 F (36.3 C)-99 F (37.2 C)] 98.5 F (36.9 C) (09/19 0036) Pulse Rate:  [48-81] 73 (09/19 0036) Resp:  [13-30] 18 (09/19 0036) BP: (117-161)/(48-108) 150/67 mmHg (09/19 0036) SpO2:  [93 %-100 %] 97 % (09/19 0036) Weight:  [60.5 kg (133 lb 6.1 oz)] 60.5 kg (133 lb 6.1 oz) (09/18 2015)  General: In bed, NAD CV: Regular rate and rhythm Mental Status: Patient does not open her eyes, though she interacts with the examiner confirming that she is awake. She does not speak or follow commands. Cranial Nerves: II: Does not blink to threat from rightPupils are equal, round, and reactive to light.  Discs are difficult to visualize. III,IV, VI: Left gaze preference V: Facial sensation is symmetric to temperature VII: Facial movement is mildly decreased on the right VIII: hearing is intact to voice X: Uvula elevates  symmetrically XI: Shoulder shrug is symmetric. XII: tongue is midline without atrophy or fasciculations.  Motor: Tone is normal. Bulk is normal. 5/5 strength was present on the left, flexion to noxious stimuli on the right Sensory: Response to noxious stimuli as less on the right than left Deep Tendon Reflexes: 2+ and symmetric in the biceps and patellae.  Cerebellar: Unable to obtain due to altered mental status. Gait: Unable to obtain due to altered mental status.   I have reviewed labs in epic and the results pertinent to this consultation are: CMP-unremarkable  I have reviewed the images obtained: CT head-left MCA territory infarction  Impression: 73 year old female with left MCA infarct. She is being admitted to step down for close monitoring. I will plan to repeat her CT in the morning to assess for development of edema. She'll need a full stroke workup.  Recommendations: 1. HgbA1c, fasting lipid panel 2. MRI, MRA  of the brain without contrast 3. Frequent neuro checks 4. Echocardiogram 5. Carotid dopplers 6. Prophylactic therapy-Antiplatelet med: Aspirin - dose  PO or  PR 7. Risk factor modification 8. Telemetry monitoring 9. PT consult, OT consult, Speech consult    Ritta Slot, MD [facility] [phone]  If 7pm- 7am, please page neurology on call as listed in AMION.

## 2013-12-03 NOTE — Progress Notes (Signed)
STROKE TEAM PROGRESS NOTE   HISTORY [name] is a 73 y.o. female with history of CAD, s/p MI 04/2008, stenting of mid LAD with DES, last stress test in May 2014 showed large fixed anterior and apical defect consistent with infarction but no ischemia, EF 39%, ischemic cardiomyopathy, HLD, HTN, type II DM and recent issues with hyperkalemia, was brought to the ED by EMS with altered mental status. Patient unable to provide history secondary to obtundation. History obtained from patient's son and daughter at bedside. Last seen well : Not known. Last heard well: 12/01/13 at approximately 7 PM. Son spoke to mother last night when she appeared well without complaints. He called her on 9/18 at about 9 AM and did not get a response. He went to her house and found the front door open. He found the patient lying on the living room floor on her right side with emesis and bowel incontinence but no bleeding. She was barely responsive and open her eyes transiently. He did not appreciate facial asymmetry. EMS was activated and patient was brought to the ED where CT head without contrast shows large left MCA territory infarct with possible thrombus in the proximal left MCA, no appreciable midline shift or hemorrhage. Neurology was consulted by EDP and recommend transferring to [facility]. Hospitalists were consulted for admission.    Patient was not administered TPA secondary to out of window. She was admitted to the neuro ICU for further evaluation and treatment.   SUBJECTIVE (INTERVAL HISTORY) Patient exam at the bedside. Her Granddaughter is in attendance. She states that patient lives by herself and patient's son found her on the floor this morning. Called 911, she was not responsive. Granddaughter states she is not moving right side but still using her left arm to play with her hair and her right leg seems to be moving now. Patient is trying to interact by grabbing granddaughter's hand on command and trying to talk.     OBJECTIVE Temp:  [97.1 F (36.2 C)-99 F (37.2 C)] 98.8 F (37.1 C) (09/19 1625) Pulse Rate:  [51-81] 57 (09/19 1625) Cardiac Rhythm:  [-] Sinus bradycardia (09/19 0839) Resp:  [13-26] 20 (09/19 1625) BP: (121-166)/(48-84) 166/84 mmHg (09/19 1625) SpO2:  [93 %-100 %] 96 % (09/19 1625) Weight:  [133 lb 6.1 oz (60.5 kg)] 133 lb 6.1 oz (60.5 kg) (09/18 2015)   Recent Labs Lab 12/03/13 0035 12/03/13 0339 12/03/13 0835 12/03/13 1224 12/03/13 1623  GLUCAP 150* 147* 151* 120* 121*    Recent Labs Lab 11/22/2013 1235 11/21/2013 1329  NA 141 140  K 4.7 4.3  CL 106 106  CO2 25  --   GLUCOSE 165* 163*  BUN 21 21  CREATININE 0.86 0.90  CALCIUM 8.3*  --     Recent Labs Lab 12/03/2013 1235  AST 24  ALT 20  ALKPHOS 66  BILITOT 0.4  PROT 7.1  ALBUMIN 3.8    Recent Labs Lab 12/09/2013 1235 11/22/2013 1329  WBC 13.1*  --   HGB 12.9 13.9  HCT 39.7 41.0  MCV 92.5  --   PLT 155  --     Recent Labs Lab 12/12/2013 1235  TROPONINI <0.30   No results found for this basename: LABPROT, INR,  in the last 72 hours  Recent Labs  11/20/2013 1817  COLORURINE YELLOW  LABSPEC 1.014  PHURINE 6.5  GLUCOSEU 100*  HGBUR NEGATIVE  BILIRUBINUR NEGATIVE  KETONESUR NEGATIVE  PROTEINUR NEGATIVE  UROBILINOGEN 0.2  NITRITE NEGATIVE  LEUKOCYTESUR TRACE*       Component Value Date/Time   CHOL 142 12/03/2013 0314   TRIG 115 12/03/2013 0314   HDL 41 12/03/2013 0314   CHOLHDL 3.5 12/03/2013 0314   VLDL 23 12/03/2013 0314   LDLCALC 78 12/03/2013 0314   Lab Results  Component Value Date   HGBA1C 6.0* 12/03/2013      Component Value Date/Time   LABOPIA NONE DETECTED 12/06/13 1817   COCAINSCRNUR NONE DETECTED 12/06/2013 1817   LABBENZ NONE DETECTED 12-06-13 1817   AMPHETMU NONE DETECTED 12/06/13 1817   THCU NONE DETECTED 12/06/2013 1817   LABBARB NONE DETECTED December 06, 2013 1817     Recent Labs Lab 12/06/2013 1235  ETH <11    Ct Head Wo Contrast  12/03/2013    IMPRESSION: Acute  infarction involving 70% of the left MCA territory as outlined above. Increasing low density and swelling. More mass effect upon the left lateral ventricle and left-to-right shift of 7 mm. No hemorrhage.   Electronically Signed   By: Paulina Fusi M.D.   On: 12/03/2013 09:56   Ct Head Wo Contrast  12-06-13    IMPRESSION: CT head: There is a fairly sizable acute infarct involving much of the left middle cerebral artery distribution. No hemorrhage or extra-axial fluid. No appreciable midline shift. Note that on axial slice 9 series 3, there is a focal area of increased attenuation which may represent a localized area of thrombus in the proximal most aspect of the left middle cerebral artery. This increased attenuation does not extend throughout the left middle cerebral artery, however.  CT cervical spine: Areas of osteoarthritic change. No disc extrusion or stenosis. No fracture or spondylolisthesis.    Ct Cervical Spine Wo Contrast  2013-12-06   CLINICAL DATA:   IMPRESSION: CT head: There is a fairly sizable acute infarct involving much of the left middle cerebral artery distribution. No hemorrhage or extra-axial fluid. No appreciable midline shift. Note that on axial slice 9 series 3, there is a focal area of increased attenuation which may represent a localized area of thrombus in the proximal most aspect of the left middle cerebral artery. This increased attenuation does not extend throughout the left middle cerebral artery, however.  CT cervical spine: Areas of osteoarthritic change. No disc extrusion or stenosis. No fracture or spondylolisthesis.    Dg Chest Port 1 View  12/06/13   CLINICAL DATA:  SOB, UNRESPONSIVE  EXAM: PORTABLE CHEST - 1 VIEW  COMPARISON:  04/29/2008  FINDINGS: The heart size and mediastinal contours are within normal limits. Both lungs are clear. The visualized skeletal structures are unremarkable.  IMPRESSION: No active disease.   Electronically Signed   By: Elige Ko   On:  December 06, 2013 12:52     PHYSICAL EXAM  Physical exam: Exam: Gen: lethargic Eyes: anicteric sclerae, moist conjunctivae HENT: Atraumatic, oropharynx clear Neck: Trachea midline; supple,  Lungs: CTA, no wheezing, rales, rhonic                          CV: RRR, no MRG Abdomen: Soft, non-tender;  Extremities: No peripheral edema  Skin: Normal temperature, no rash,  Psych: Appropriate affect, pleasant  Neuro: Patient is lethargic. She squeezes granddaughter's hand. She tries to open eyes to sternal rub. Perrl, left gaze preference, no movement of right arm, withdraws with other extremities, right toe upgoing, left down, spontaneously moving left arm to fix hair, reflexes 1+.   ASSESSMENT/PLAN  73 y.o. female  with history of CAD, s/p MI 04/2008 and stenting, ischemic cardiomyopathy, HLD, HTN, type II DM and recent issues with hyperkalemia who was found on the floor and was brought to the ED by EMS with altered mental status.  She did not receive IV t-PA  Due to being out of window. Imaging confirms large left MCA territory  Stroke work up underway.  Ischemic large left mca stroke  Need frequent Neuro checks; due to size of the stroke is concerning for edema and herniation. Low threshold for repeat CT.  If concerning for progression to malignant MCA infarction would consult NSY to get onboard Pending MRI results would consider starting 3% saline with a goal Na of 145-155. Need CCM consult for central line. She should be moved to the Neuro ICU   MRI  pending  MRA  pending  Carotid Doppler    2D Echo  EF 30-35% with grade 2 diastolic dysfunction  LDL 78, continue statin as necessary as meeting goal of LDL <100  HgbA1c 6.0  Hypertension   Permissive HTN to 220/105  Hyperlipidemia  LDL 78  LDL goal < 100 (<70 for diabetics)  Diabetes  Home meds:  On Metformin   HgbA1c 6.0  Goal < 7.0  Other Stroke Risk Factors Advanced age   Coronary artery disease  ** Relayed plan  to hospitalist.   Artemio Aly. MD Stroke Neuro   To contact Stroke Continuity provider, please refer to [email or web address]. After hours, contact General Neurology

## 2013-12-03 NOTE — Progress Notes (Signed)
Patient with malignant MCA infarction now with increased contralateral tone and Cheyne-Stokes breathing. Continues to have right gaze preference, pupils are still equal. Given her progression in exam, I do feel that she has an imminently life threatening situation. After discussion with the family, I indicated that I suspected that in a best case scenario, she would be severely debilitated with aphasia, and  given the worsening exam may have difficulty with swallowing and breathing.   Given this, the family has decided to not pursue aggressive measures, and wish to pursue comfort care only from this point forward. I feel that this is a reasonable decision.  Ritta Slot, MD [facility] [phone]  If 7pm- 7am, please page neurology on call as listed in AMION.

## 2013-12-03 NOTE — Progress Notes (Signed)
PT Cancellation Note  Patient Details Name: [name] MRN: [id number] DOB: 27-Jul-1940   Cancelled Treatment:    Reason Eval/Treat Not Completed: Patient not medically ready (CT shows incr swelling)   Kelby Lotspeich 12/03/2013, 1:22 PM

## 2013-12-03 NOTE — Plan of Care (Signed)
Problem: Consults Goal: Intracranial Hemorrhage Patient Education See Patient Education Module for education specifics.  Outcome: Not Progressing Patient remains unreponsive Goal: Nutrition Consult-if indicated Outcome: Not Progressing Patient is unable to eat at this time  Problem: Acute Treatment Outcomes Goal: Neuro exam at baseline or improved Outcome: Not Progressing Patient's status remains the same

## 2013-12-03 NOTE — Progress Notes (Signed)
Patient with high respiration and fluctuating o2 sats - Critical Care MD at the bedside - Order to place patient on 2L of oxygen via [place]. Patient tolerating oxygen via [place] with decreased resp from 38 to 20 and sats from 91% to 94% at this current time. Stephenie Acres 12/03/2013 8:29 PM

## 2013-12-03 NOTE — Progress Notes (Signed)
eLink Physician-Brief Progress Note Patient Name: [name] DOB: [birth year] MRN: [id number]   Date of Service  12/03/2013  HPI/Events of Note   Spoke to Dr. Jeral Fruit of neurosurgery. He has not yet seen or evaluated the patient. He did discuss with the neurologist who admitted the patient. He does not think there is any role for a decompressive craniotomy. The bedside MD from PCCM has just evaluated the patient and has noted bradycardia, and Cheyne-Stokes breathing.       eICU Interventions   Bedside PCCM MD to intubate and place central line.  Dr. Jeral Fruit to see patient to offer surgical opinion.         Intervention Category Major Interventions: Change in mental status - evaluation and management  Lilo Wallington R. 12/03/2013, 9:11 PM

## 2013-12-03 NOTE — Progress Notes (Signed)
PT Cancellation Note  Patient Details Name: [name] MRN: [id number] DOB: 1940-07-07   Cancelled Treatment:    Reason Eval/Treat Not Completed: Patient not medically ready (Pt with bedrest orders. Need incr activity orders when appropriate.)   [facility] 12/03/2013, 9:23 AM

## 2013-12-04 DIAGNOSIS — Z66 Do not resuscitate: Secondary | ICD-10-CM | POA: Diagnosis not present

## 2013-12-04 DIAGNOSIS — Z515 Encounter for palliative care: Secondary | ICD-10-CM

## 2013-12-04 MED ORDER — MORPHINE SULFATE 2 MG/ML IJ SOLN
1.0000 mg | INTRAMUSCULAR | Status: DC | PRN
  Administered 2013-12-04 (×2): 2 mg via INTRAVENOUS
  Filled 2013-12-04 (×2): qty 1

## 2013-12-04 MED ORDER — MORPHINE SULFATE 10 MG/ML IJ SOLN
1.0000 mg/h | INTRAMUSCULAR | Status: DC
  Administered 2013-12-04: 2 mg/h via INTRAVENOUS
  Filled 2013-12-04: qty 10

## 2013-12-15 NOTE — Progress Notes (Signed)
Patients family approached me in the hallway and asked if I would call the doctor to obtain some Morpine for the patient to help keep her comfortable and help her breathing. I paged the doctor and obtained order. Patients family given joice of  or  to be administered by this Clinical research associate - patients family decided to give full  to ensure comfort.  Stephenie Acres 12/01/2013. 1:03 AM

## 2013-12-15 NOTE — Progress Notes (Signed)
PT Cancellation Note  Patient Details Name: [name] MRN: [id number] DOB: Jul 25, 1940   Cancelled Treatment:       Noting continuing decline, and pt's family choosing comfort measures only;  Will sign off;   Van Clines, PT  Acute Rehabilitation Services Pager [phone] Office [phone]    Van Clines [facility] December 23, 2013, 8:06 AM

## 2013-12-15 NOTE — Progress Notes (Signed)
Patient given Morphine  at 0555 - As of 0630 the patient has shown no decrease in respirations (maintaining between 30-34) and O2 sat maintaining between 86-89. Patient with Cheyne-Stokes Respirations wearing a Vent-Mask at 55%. Family at the bedside requesting something to make her more comfortable. Stephenie Acres 12/06/2013 6:30 AM

## 2013-12-15 NOTE — Discharge Summary (Signed)
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 Active Problems:   Coronary artery disease   Ischemic cardiomyopathy   Hyperlipidemia   Hypertension   Diabetes mellitus type 2, noninsulin dependent   Coma   CVA (cerebral vascular accident)   DNR (do not resuscitate)    History of present illness/ Hospital Course:   Patient is a 73 year old who presented to be with right hemiparesis and aphasia. Further workup with MRI and CT scan would reveal a large acute left MCA territory infarct.further evaluation with MRI would also reveal that brain showed swelling with a 9.5 mm to right shift.  Neurology evaluated patient and determined that prognosis was poor and subsequently palliative care medicine was consulted for comfort care measures.  Performed physical exam confirming patient's passing away.  Time: 1047  Signed:  Penny Pia  [facility] 2013-12-12, 10:47 AM

## 2013-12-15 NOTE — Progress Notes (Signed)
Patient had increased respiration into the 40's and o2 sat was dropping between 68%-77%. Patient placed on non-rebreather mask. - O2 sat improved within 5 minutes of use to approx 89% Family at the bedside. Morphione gtt continues to run to the patient at a rate of  per hour. Stephenie Acres 11/22/2013 7:45 AM

## 2013-12-15 NOTE — Progress Notes (Signed)
Patient ID: [name], female   DOB: [birth year], 73 y.o.   MRN: [id number] Family made the wise decision not to be aggressive. She is in coma, poor respiratory efforts. Few hours to live. i did talk to her family

## 2013-12-15 NOTE — Consult Note (Signed)
NAME:  [name], [name], [name]  MEDICAL RECORD NO.:  [id number]  LOCATION:  2C16C                        FACILITY:  [facility]  PHYSICIAN:  Hilda Lias, M.D.   DATE OF BIRTH:  March 24, 1940  DATE OF CONSULTATION:  12/03/2013 DATE OF DISCHARGE:                                CONSULTATION   [name] is a lady who was admitted yesterday after it was found that she has a large left middle cerebral artery stroke.  The patient was kept in the step-down unit, and today, she had a repeat MRI which showed worse swelling of the left hemisphere with impending herniation.  I was approached __________unofficially by the neurologist on call, about 7:30 _tonite_________ to show me the MRI and see if I was in the opinion about doing craniotomy.  As I told her, already we are dealing almost 48 hours after the stroke in a lady who is 73 years old with a large MCA stroke, I doubt the outcome will be of any benefit.  Because of that, no note was written.  Then about 45 minutes ago, I was called from the Critical Care Medicine for an official consult because of the findings of the MRI.  I came immediately to see [name], this lady was in coma.  She has _decortication _________ in the left arm.  The eyes were deviated to the right side, and she was having Cheyne-Stokes type of respiration.  Immediately I called Dr. Petra Kuba who was the Neurology on call, and we talked at length with the family.  We explained the natural history of the stroke in a person who is over 62 years old.  We told them that seriously I doubt that surgery will be of any benefit.  It will probably keep her alive, but she will have most likely she will be in a__________ situation in which she is going to be completely dependent of others with tracheostomy and gastrostomy for feeding.  I also emphasized to them that based on the clinical situation at the present and the finding of the MRI, the surgery  would be something __________ the family, but will not benefit the patient whatsoever.  Then, the decision was to be made about going ahead with respirator and 3% saline solution.  That decision had to be made by them.  Since craniotomy is out of the question, I left Dr. Petra Kuba and Dr. Verdie Mosher to wait for the family decision about the 2nd possibility.  Again, the family knows that with the second option, still her outcome will be absolutely poor.  They are more inclined to proceed with no intervention, whatsoever, and left her _disease_________ to continue its course.          ______________________________ Hilda Lias, M.D.     EB/MEDQ  D:  12/03/2013  T:  12/03/2013  Job:  [id number]

## 2013-12-15 NOTE — Progress Notes (Signed)
Stroke team will sign off. Please call if we can be of further service.  Delton See PA-C [facility] Pager [phone] 2013/12/17, 12:33 PM

## 2013-12-15 DEATH — deceased

## 2014-01-27 ENCOUNTER — Ambulatory Visit: Payer: Medicare Other | Admitting: Cardiology

## 2014-10-12 IMAGING — CT CT HEAD W/O CM
3 of 4 series · 15 of 30 positions shown, 18 images · non-contrast
Comparison: None.

CLINICAL DATA: Pain and altered mental status post trauma

EXAM:
CT HEAD WITHOUT CONTRAST
CT CERVICAL SPINE WITHOUT CONTRAST
TECHNIQUE: Multidetector CT imaging of the head and cervical spine was
performed following the standard protocol without intravenous
contrast. Multiplanar CT image reconstructions of the cervical spine
were also generated.

[Series 4: bone windows · axial · 0.40mm/px · z∈[-145,-64]mm · 4 of 47 slices shown]
[im 10/47  bone]
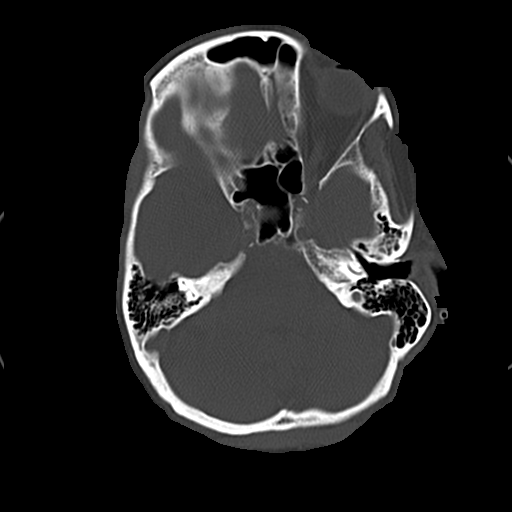
[im 19/47  bone]
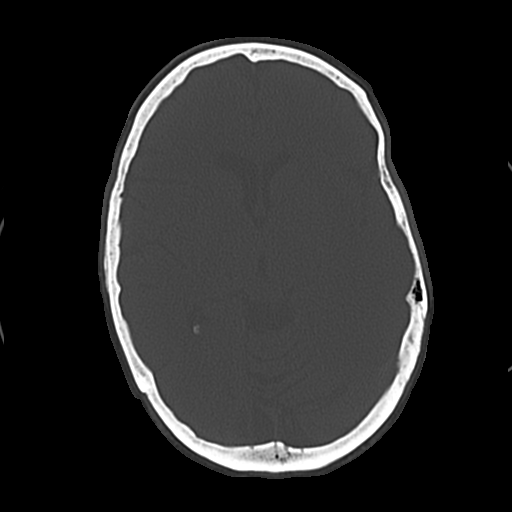
[im 28/47  bone]
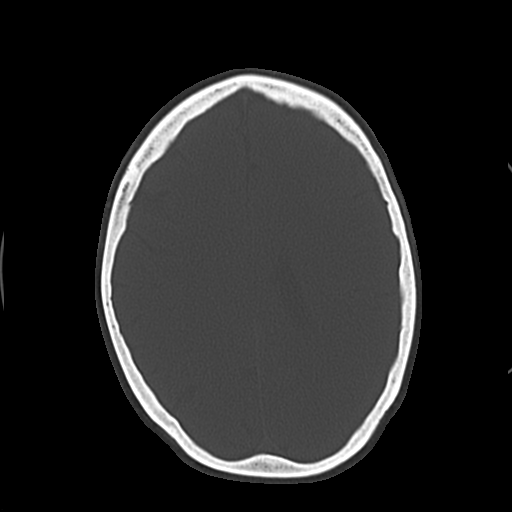
[im 37/47  bone]
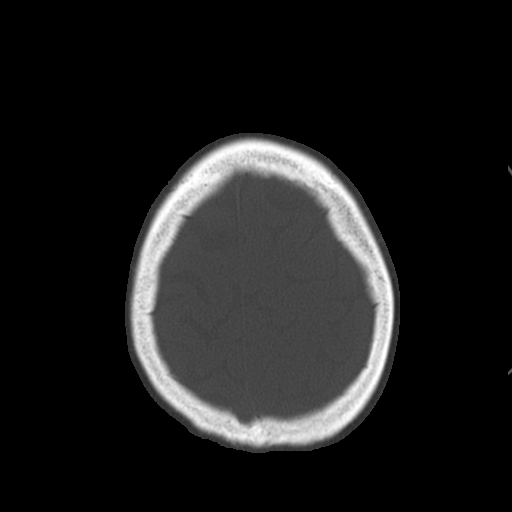

[Series 5: c-spine st · axial · 0.28mm/px · z∈[-299,-281]mm · 2 of 78 slices shown]
[im 9/78  brain]
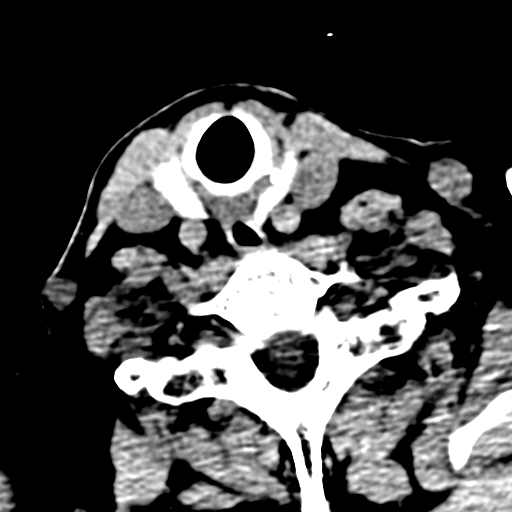
[im 18/78  brain]
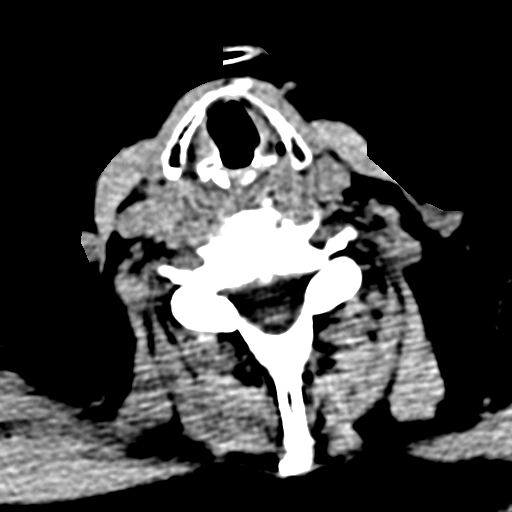

[Series 8: axial recon · axial · 0.23mm/px · z∈[-320,-192]mm · 9 of 83 slices shown, 12 images]
[im 9/83  brain]
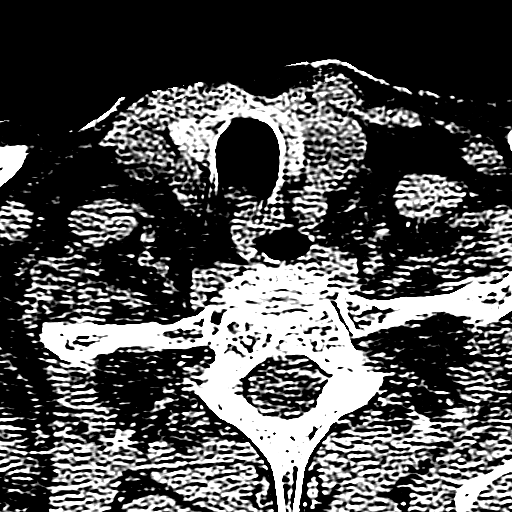
[im 9/83  bone]
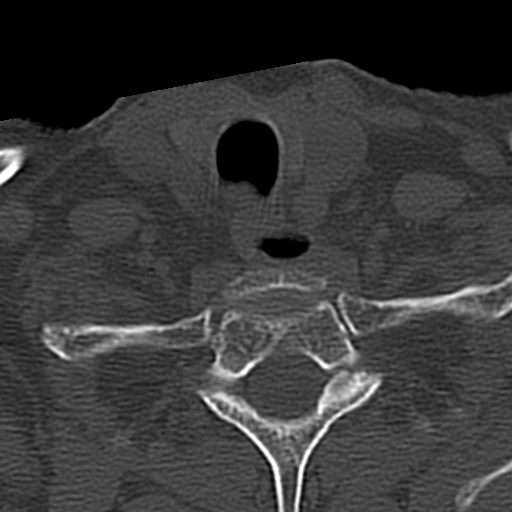
[im 17/83  brain]
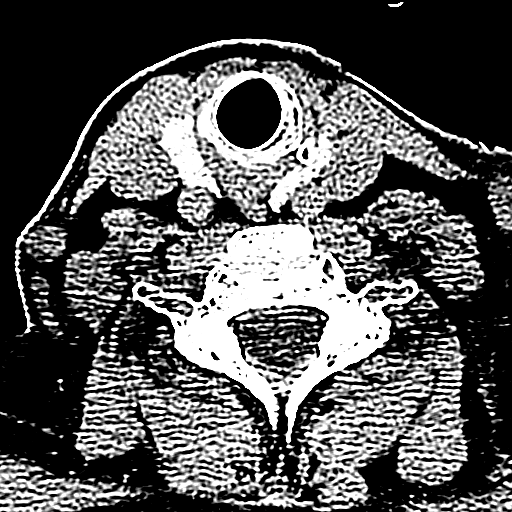
[im 25/83  brain]
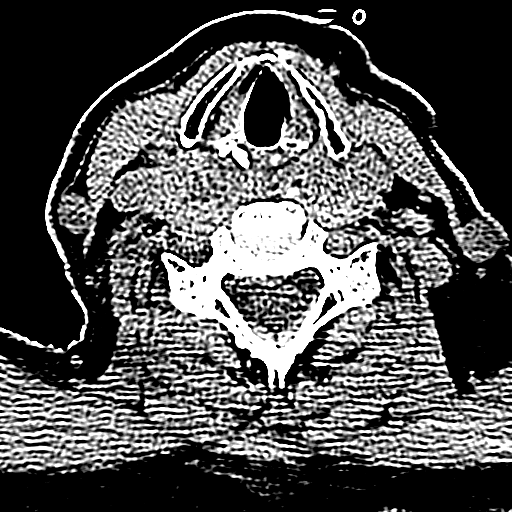
[im 33/83  brain]
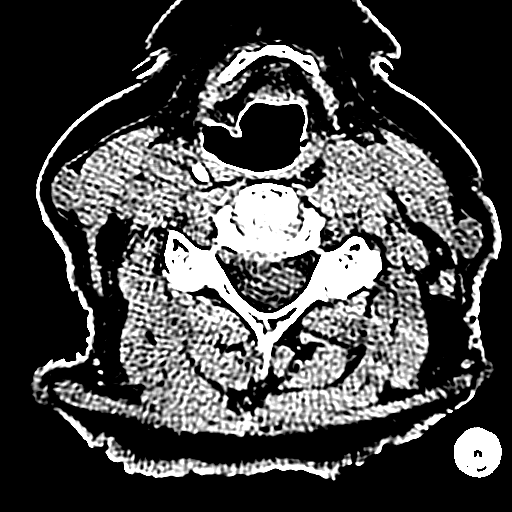
[im 42/83  brain]
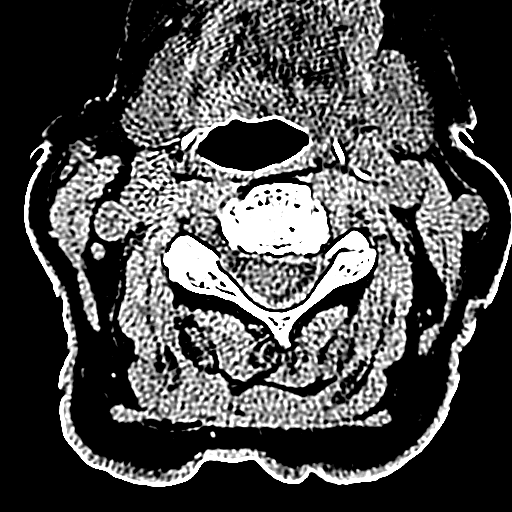
[im 42/83  bone]
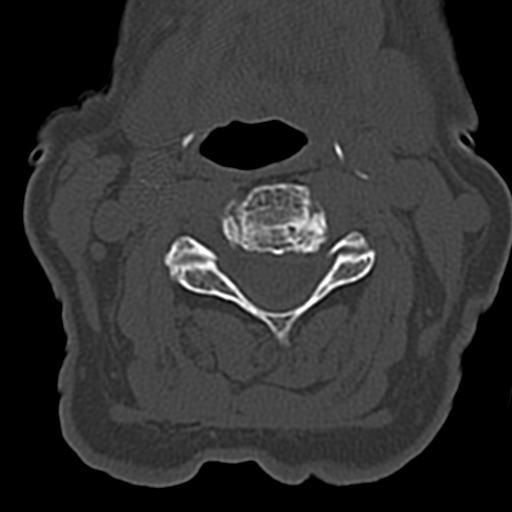
[im 50/83  brain]
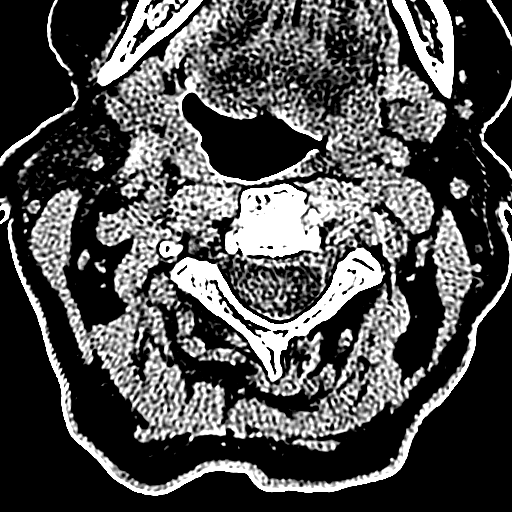
[im 58/83  brain]
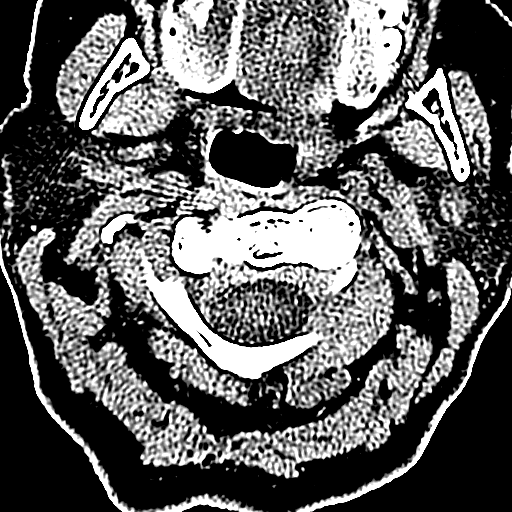
[im 66/83  brain]
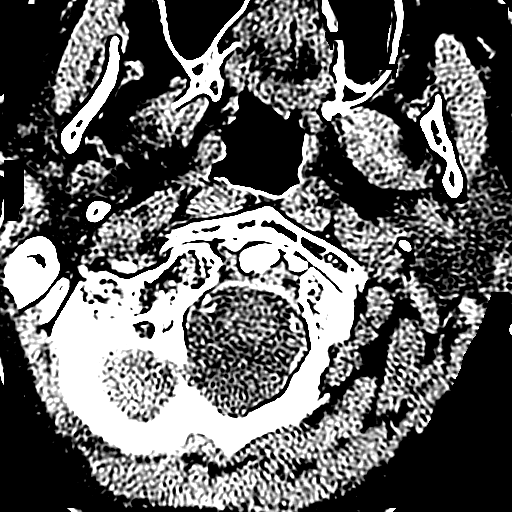
[im 74/83  brain]
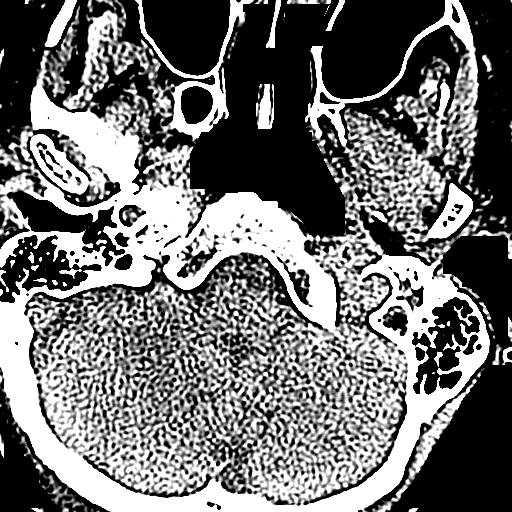
[im 74/83  bone]
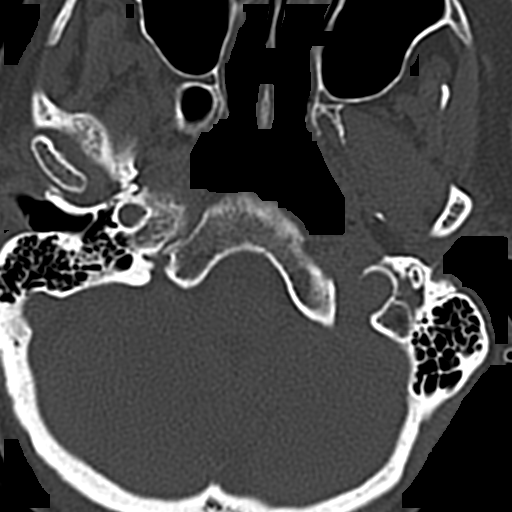

[15 of 30 positions shown; findings below may reference images not displayed]

FINDINGS: CT HEAD FINDINGS

The ventricles are normal in size and configuration. The right
lateral ventricle is slightly larger than the left lateral
ventricle, a probable anatomic variant.

There is no mass, hemorrhage, extra-axial fluid collection, or
midline shift.

There is decreased attenuation throughout much of the left temporal
lobe as well as portions of the left claustrum, extreme capsule, and
insular cortex consistent with an acute infarct involving a portion
of the left middle cerebral artery distribution with associated
cytotoxic edema. No other focal infarct is seen. Elsewhere
gray-white compartments appear unremarkable. Bony calvarium appears
intact. The mastoid air cells are clear.

CT CERVICAL SPINE FINDINGS

There is no fracture or spondylolisthesis. Prevertebral soft tissues
and predental space regions are normal. There is moderate disc space
narrowing at C3-4. There is mild disc space narrowing at C5-6. There
is facet hypertrophy at multiple levels bilaterally. No nerve root
edema or effacement. No disc extrusion or stenosis.
IMPRESSION: CT head: There is a fairly sizable acute infarct involving much of
the left middle cerebral artery distribution. No hemorrhage or
extra-axial fluid. No appreciable midline shift. Note that on axial
slice 9 series 3, there is a focal area of increased attenuation
which may represent a localized area of thrombus in the proximal
most aspect of the left middle cerebral artery. This increased
attenuation does not extend throughout the left middle cerebral
artery, however.

CT cervical spine: Areas of osteoarthritic change. No disc extrusion
or stenosis. No fracture or spondylolisthesis.

These results were called by telephone at the time of interpretation
on 12/02/2013 at [DATE] to Dr. FERIENHAUS ERXLEBEN , who verbally
acknowledged these results.
